# Patient Record
Sex: Male | Born: 1995 | Race: Black or African American | Hispanic: No | Marital: Single | Smoking: Current every day smoker
Health system: Southern US, Community
[De-identification: ages and names within clinical notes are randomized; demographics above are authoritative.]

## PROBLEM LIST (undated history)

## (undated) DIAGNOSIS — F419 Anxiety disorder, unspecified: Secondary | ICD-10-CM

## (undated) HISTORY — PX: CHOLECYSTECTOMY: SHX55

---
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
## 2017-04-21 ENCOUNTER — Emergency Department (HOSPITAL_BASED_OUTPATIENT_CLINIC_OR_DEPARTMENT_OTHER)
Admission: EM | Admit: 2017-04-21 | Discharge: 2017-04-21 | Disposition: A | Discharge status: Home / Self Care | Payer: BLUE CROSS/BLUE SHIELD | Attending: Emergency Medicine | Admitting: Emergency Medicine

## 2017-04-21 ENCOUNTER — Encounter (HOSPITAL_BASED_OUTPATIENT_CLINIC_OR_DEPARTMENT_OTHER): Payer: Self-pay

## 2017-04-21 DIAGNOSIS — F17200 Nicotine dependence, unspecified, uncomplicated: Secondary | ICD-10-CM | POA: Diagnosis not present

## 2017-04-21 DIAGNOSIS — Z202 Contact with and (suspected) exposure to infections with a predominantly sexual mode of transmission: Secondary | ICD-10-CM

## 2017-04-21 LAB — WET PREP, GENITAL
Sperm: NONE SEEN
Yeast Wet Prep HPF POC: NONE SEEN

## 2017-04-21 LAB — URINALYSIS, ROUTINE W REFLEX MICROSCOPIC
Bilirubin Urine: NEGATIVE
GLUCOSE, UA: NEGATIVE mg/dL
HGB URINE DIPSTICK: NEGATIVE
KETONES UR: NEGATIVE mg/dL
LEUKOCYTES UA: NEGATIVE
Nitrite: NEGATIVE
PROTEIN: NEGATIVE mg/dL
Specific Gravity, Urine: 1.025 (ref 1.005–1.030)
pH: 6 (ref 5.0–8.0)

## 2017-04-21 MED ORDER — AZITHROMYCIN 250 MG PO TABS
1000.0000 mg | ORAL_TABLET | Freq: Once | ORAL | Status: AC
  Administered 2017-04-21: 1000 mg via ORAL
  Filled 2017-04-21: qty 4

## 2017-04-21 MED ORDER — CEFTRIAXONE SODIUM 250 MG IJ SOLR
250.0000 mg | Freq: Once | INTRAMUSCULAR | Status: AC
  Administered 2017-04-21: 250 mg via INTRAMUSCULAR
  Filled 2017-04-21: qty 250

## 2017-04-21 MED ORDER — METRONIDAZOLE 500 MG PO TABS
2000.0000 mg | ORAL_TABLET | Freq: Once | ORAL | Status: AC
  Administered 2017-04-21: 2000 mg via ORAL
  Filled 2017-04-21: qty 4

## 2017-04-21 NOTE — ED Triage Notes (Signed)
Pt states male he had intercourse with called yesterday and advised him she was pos for STD-pt denies penile d/c and dysuria

## 2017-04-21 NOTE — Discharge Instructions (Addendum)
You have been treated for gonorrhea, chlamydia, and trichomonas today. Do not drink alcohol within 24 hours of today's visit, as it could interact with the medications were given make you feel very sick. You will be called in 3 days this days if any of your tests return positive. In that case, please make all of your sexual partners aware that they will need to be treated as well. Abstain from intercourse for one week until you have both been treated. Use condoms in the future to help prevent sexually transmitted disease and unwanted pregnancy. You can go to the health department in the future for free STD testing.

## 2017-04-21 NOTE — ED Provider Notes (Signed)
[facility] Provider [facility]   CSN: [id number] Arrival date & time: 04/21/17  1520     History   Chief Complaint Chief Complaint  Patient presents with  . Exposure to STD    HPI Jeffrey Mercer is a 21 y.o. male who presents with concern for STD exposure. Patient denies any symptoms such as dysuria, penile discharge, testicular pain or swelling. He reports he was called by a male that he recently had intercourse who was exposed to disease that starts with a "T." that is all he knows. He presents to be checked. He denies any fevers, abdominal pain, nausea, vomiting.  HPI  History reviewed. No pertinent past medical history.  There are no active problems to display for this patient.   Past Surgical History:  Procedure Laterality Date  . CHOLECYSTECTOMY         Home Medications    Prior to Admission medications   Not on File    Family History No family history on file.  Social History Social History  Substance Use Topics  . Smoking status: Current Every Day Smoker  . Smokeless tobacco: Never Used  . Alcohol use Yes     Comment: weekly     Allergies   Patient has no known allergies.   Review of Systems Review of Systems  Constitutional: Negative for fever.  Gastrointestinal: Negative for abdominal pain, nausea and vomiting.  Genitourinary: Negative for discharge, dysuria, penile pain, penile swelling, scrotal swelling and testicular pain.     Physical Exam Updated Vital Signs BP (!) 142/85 (BP Location: Left Arm)   Pulse 72   Temp 98.2 F (36.8 C) (Oral)   Resp 18   Ht  (1.753 m)   Wt 72.6 kg (160 lb)   SpO2 100%   BMI 23.63 kg/m   Physical Exam  Constitutional: He appears well-developed and well-nourished. No distress.  HENT:  Head: Normocephalic and atraumatic.  Mouth/Throat: Oropharynx is clear and moist. No oropharyngeal exudate.  Eyes: Pupils are equal, round, and reactive to light. Conjunctivae are normal. Right eye  exhibits no discharge. Left eye exhibits no discharge. No scleral icterus.  Neck: Normal range of motion. Neck supple. No thyromegaly present.  Cardiovascular: Normal rate, regular rhythm, normal heart sounds and intact distal pulses.  Exam reveals no gallop and no friction rub.   No murmur heard. Pulmonary/Chest: Effort normal and breath sounds normal. No stridor. No respiratory distress. He has no wheezes. He has no rales.  Abdominal: Soft. Bowel sounds are normal. He exhibits no distension. There is no tenderness. There is no rebound and no guarding.  Genitourinary: Testes normal and penis normal. Right testis shows no swelling and no tenderness. Left testis shows no swelling and no tenderness. Circumcised. No discharge found.  Musculoskeletal: He exhibits no edema.  Lymphadenopathy:    He has no cervical adenopathy.  Neurological: He is alert. Coordination normal.  Skin: Skin is warm and dry. No rash noted. He is not diaphoretic. No pallor.  Psychiatric: He has a normal mood and affect.  Nursing note and vitals reviewed.    ED Treatments / Results  Labs (all labs ordered are listed, but only abnormal results are displayed) Labs Reviewed  WET PREP, GENITAL - Abnormal; Notable for the following:       Result Value   Trich, Wet Prep PRESENT (*)    Clue Cells Wet Prep HPF POC PRESENT (*)    WBC, Wet Prep HPF POC FEW (*)  All other components within normal limits  URINALYSIS, ROUTINE W REFLEX MICROSCOPIC  GC/CHLAMYDIA PROBE AMP ([place]) NOT AT [facility]    EKG  EKG Interpretation None       Radiology No results found.  Procedures Procedures (including critical care time)  Medications Ordered in ED Medications  cefTRIAXone (ROCEPHIN) injection 250 mg (250 mg Intramuscular Given 04/21/17 1700)  azithromycin (ZITHROMAX) tablet 1,000 mg (1,000 mg Oral Given 04/21/17 1658)  metroNIDAZOLE (FLAGYL) tablet 2,000 mg (2,000 mg Oral Given 04/21/17 1659)     Initial  Impression / Assessment and Plan / ED Course  I have reviewed the triage vital signs and the nursing notes.  Pertinent labs & imaging results that were available during my care of the patient were reviewed by me and considered in my medical decision making (see chart for details).     Patient treated in the ED for STI with Rocephin, azithromycin, Flagyl. He was advised to avoid alcohol for 24 hours after treatment. Wet prep positive for Trichomonas. UA negative. Patient advised to inform and treat all sexual partners.  Pt advised on safe sex practices and understands that they have GC/Chlamydia cultures pending and will result in 2-3 days. HIV and RPR declined. Pt encouraged to follow up at local health department for future STI checks. No concern for prostatitis or epididymitis. Discussed return precautions. Patient understands and agrees with plan. Patient vitals stable throughout ED course and discharged in satisfactory condition.    Final Clinical Impressions(s) / ED Diagnoses   Final diagnoses:  Exposure to STD    New Prescriptions There are no discharge medications for this patient.    Emi Holes, PA-C 04/21/17 1729    Pricilla Loveless, MD 04/21/17 [phone]

## 2017-04-24 LAB — GC/CHLAMYDIA PROBE AMP (~~LOC~~) NOT AT ARMC
CHLAMYDIA, DNA PROBE: NEGATIVE
Neisseria Gonorrhea: NEGATIVE

## 2017-10-10 DIAGNOSIS — S52509A Unspecified fracture of the lower end of unspecified radius, initial encounter for closed fracture: Secondary | ICD-10-CM | POA: Insufficient documentation

## 2017-12-07 DIAGNOSIS — M25631 Stiffness of right wrist, not elsewhere classified: Secondary | ICD-10-CM | POA: Insufficient documentation

## 2018-03-06 ENCOUNTER — Emergency Department (HOSPITAL_BASED_OUTPATIENT_CLINIC_OR_DEPARTMENT_OTHER)
Admission: EM | Admit: 2018-03-06 | Discharge: 2018-03-06 | Disposition: A | Discharge status: Home / Self Care | Payer: BLUE CROSS/BLUE SHIELD | Attending: Emergency Medicine | Admitting: Emergency Medicine

## 2018-03-06 ENCOUNTER — Encounter (HOSPITAL_BASED_OUTPATIENT_CLINIC_OR_DEPARTMENT_OTHER): Payer: Self-pay | Admitting: *Deleted

## 2018-03-06 ENCOUNTER — Other Ambulatory Visit: Payer: Self-pay

## 2018-03-06 DIAGNOSIS — K219 Gastro-esophageal reflux disease without esophagitis: Secondary | ICD-10-CM | POA: Insufficient documentation

## 2018-03-06 DIAGNOSIS — F17200 Nicotine dependence, unspecified, uncomplicated: Secondary | ICD-10-CM | POA: Insufficient documentation

## 2018-03-06 DIAGNOSIS — Z202 Contact with and (suspected) exposure to infections with a predominantly sexual mode of transmission: Secondary | ICD-10-CM | POA: Insufficient documentation

## 2018-03-06 DIAGNOSIS — Z711 Person with feared health complaint in whom no diagnosis is made: Secondary | ICD-10-CM

## 2018-03-06 LAB — URINALYSIS, ROUTINE W REFLEX MICROSCOPIC
Bilirubin Urine: NEGATIVE
GLUCOSE, UA: NEGATIVE mg/dL
Hgb urine dipstick: NEGATIVE
Ketones, ur: 15 mg/dL — AB
Leukocytes, UA: NEGATIVE
Nitrite: NEGATIVE
PROTEIN: NEGATIVE mg/dL
Specific Gravity, Urine: 1.02 (ref 1.005–1.030)
pH: 6 (ref 5.0–8.0)

## 2018-03-06 MED ORDER — PANTOPRAZOLE SODIUM 20 MG PO TBEC: 20.0000 mg | DELAYED_RELEASE_TABLET | Freq: Every day | ORAL | 0 refills | Status: AC

## 2018-03-06 NOTE — ED Triage Notes (Signed)
States it has been a year since he had an STD check and feels it is time for another check. No symptoms. States he has an upset stomach after he eats salad. He is constipated and has blood in his stools after a BM sometimes.

## 2018-03-06 NOTE — ED Provider Notes (Signed)
[facility] EMERGENCY DEPARTMENT Provider Note   CSN: [id number] Arrival date & time: 03/06/18  1302     History   Chief Complaint Chief Complaint  Patient presents with  . SEXUALLY TRANSMITTED DISEASE    HPI Jeffrey Mercer is a 22 y.o. male.  Patient here with multiple complaints.  First, patient is here wanting an STD check in with gonorrhea, chlamydia, HIV testing.  He denies any symptoms including penile discharge, dysuria, painful lesions.  No history of STDs.  Patient also here as he has had some reflux type symptoms with eating greasy foods.  Patient had his gallbladder removed in the past and ever since has had difficulty eating hamburgers and other fatty foods.  Intermittently he will have some blood in his stool but has not had any and multiple days to weeks.  The history is provided by the patient.  Illness  This is a new problem. The current episode started more than 1 week ago. The problem occurs constantly. The problem has not changed since onset.Pertinent negatives include no chest pain, no abdominal pain, no headaches and no shortness of breath. The symptoms are aggravated by eating. Nothing relieves the symptoms. He has tried nothing for the symptoms. The treatment provided no relief.    History reviewed. No pertinent past medical history.  There are no active problems to display for this patient.   Past Surgical History:  Procedure Laterality Date  . CHOLECYSTECTOMY          Home Medications    Prior to Admission medications   Medication Sig Start Date End Date Taking? Authorizing Provider  pantoprazole (PROTONIX) 20 MG tablet Take 1 tablet (20 mg total) by mouth daily. 03/06/18 04/05/18  Virgina Norfolk, DO    Family History No family history on file.  Social History Social History   Tobacco Use  . Smoking status: Current Every Day Smoker  . Smokeless tobacco: Never Used  Substance Use Topics  . Alcohol use: Yes    Comment: weekly  .  Drug use: Not on file     Allergies   Patient has no known allergies.   Review of Systems Review of Systems  Constitutional: Negative for chills and fever.  HENT: Negative for ear pain and sore throat.   Eyes: Negative for pain and visual disturbance.  Respiratory: Negative for cough and shortness of breath.   Cardiovascular: Negative for chest pain and palpitations.  Gastrointestinal: Positive for blood in stool. Negative for abdominal distention, abdominal pain, constipation, diarrhea, nausea, rectal pain and vomiting.  Genitourinary: Negative for dysuria and hematuria.  Musculoskeletal: Negative for arthralgias and back pain.  Skin: Negative for color change and rash.  Neurological: Negative for seizures, syncope and headaches.  All other systems reviewed and are negative.    Physical Exam Updated Vital Signs  ED Triage Vitals  Enc Vitals Group     BP 03/06/18 1310 (!) 141/99     Pulse Rate 03/06/18 1310 64     Resp 03/06/18 1310 18     Temp 03/06/18 1310 98.4 F (36.9 C)     Temp Source 03/06/18 1310 Oral     SpO2 03/06/18 1310 99 %     Weight 03/06/18 1308 160 lb (72.6 kg)     Height 03/06/18 1308 5\' 9"  (1.753 m)     Head Circumference --      Peak Flow --      Pain Score 03/06/18 1308 0     Pain Loc --  Pain Edu? --      Excl. in GC? --     Physical Exam  Constitutional: He appears well-developed and well-nourished.  HENT:  Head: Normocephalic and atraumatic.  Eyes: Conjunctivae are normal.  Neck: Normal range of motion. Neck supple.  Cardiovascular: Normal rate, regular rhythm, normal heart sounds and intact distal pulses.  No murmur heard. Pulmonary/Chest: Effort normal and breath sounds normal. No respiratory distress.  Abdominal: Soft. Bowel sounds are normal. There is no tenderness. Hernia confirmed negative in the right inguinal area and confirmed negative in the left inguinal area.  Genitourinary: Testes normal and penis normal. Cremasteric  reflex is present. Right testis shows no mass, no swelling and no tenderness. Left testis shows no mass, no swelling and no tenderness. No penile tenderness.  Musculoskeletal: He exhibits no edema.  Neurological: He is alert.  Skin: Skin is warm and dry.  Psychiatric: He has a normal mood and affect.  Nursing note and vitals reviewed.    ED Treatments / Results  Labs (all labs ordered are listed, but only abnormal results are displayed) Labs Reviewed  URINALYSIS, ROUTINE W REFLEX MICROSCOPIC - Abnormal; Notable for the following components:      Result Value   Ketones, ur 15 (*)    All other components within normal limits  HIV ANTIBODY (ROUTINE TESTING)  GC/CHLAMYDIA PROBE AMP ([place]) NOT AT [facility]    EKG None  Radiology No results found.  Procedures Procedures (including critical care time)  Medications Ordered in ED Medications - No data to display   Initial Impression / Assessment and Plan / ED Course  I have reviewed the triage vital signs and the nursing notes.  Pertinent labs & imaging results that were available during my care of the patient were reviewed by me and considered in my medical decision making (see chart for details).     Jeffrey Mercer is a 22 year old male with no significant medical history who presents to the ED for STD check and reflux.  Patient with normal vitals.  No fever.  Patient here for HIV testing and gonorrhea and Chlamydia testing.  Patient also states that ever since his gallbladder was taken out he has difficulty with fatty foods.  Sometimes has blood in his stool but has not had any in weeks.  Patient does not have primary caregiver.  Overall is asymptomatic.  Has no signs of lesions on his penis at this time.  No concern for herpes.  Gonorrhea/chlamydia testing collected as well as HIV testing.  Urinalysis unremarkable.  Patient appears to have symptoms consistent with reflux and given prescription for Protonix.  Given information to  follow-up with primary care doctor and discharged from ED in good condition.  Final Clinical Impressions(s) / ED Diagnoses   Final diagnoses:  Concern about STD in male without diagnosis  Gastroesophageal reflux disease without esophagitis    ED Discharge Orders         Ordered    pantoprazole (PROTONIX) 20 MG tablet  Daily     03/06/18 1446           Virgina NorfolkCuratolo, Hazley Dezeeuw, DO 03/06/18 1456

## 2018-03-07 LAB — GC/CHLAMYDIA PROBE AMP (~~LOC~~) NOT AT ARMC
CHLAMYDIA, DNA PROBE: NEGATIVE
NEISSERIA GONORRHEA: NEGATIVE

## 2018-03-07 LAB — HIV ANTIBODY (ROUTINE TESTING W REFLEX): HIV Screen 4th Generation wRfx: NONREACTIVE

## 2019-05-20 ENCOUNTER — Encounter (HOSPITAL_BASED_OUTPATIENT_CLINIC_OR_DEPARTMENT_OTHER): Payer: Self-pay

## 2019-05-20 ENCOUNTER — Emergency Department (HOSPITAL_BASED_OUTPATIENT_CLINIC_OR_DEPARTMENT_OTHER): Payer: BC Managed Care – PPO

## 2019-05-20 ENCOUNTER — Emergency Department (HOSPITAL_BASED_OUTPATIENT_CLINIC_OR_DEPARTMENT_OTHER)
Admission: EM | Admit: 2019-05-20 | Discharge: 2019-05-20 | Disposition: A | Discharge status: Home / Self Care | Payer: BC Managed Care – PPO | Attending: Emergency Medicine | Admitting: Emergency Medicine

## 2019-05-20 ENCOUNTER — Other Ambulatory Visit: Payer: Self-pay

## 2019-05-20 DIAGNOSIS — X500XXA Overexertion from strenuous movement or load, initial encounter: Secondary | ICD-10-CM | POA: Insufficient documentation

## 2019-05-20 DIAGNOSIS — Y929 Unspecified place or not applicable: Secondary | ICD-10-CM | POA: Insufficient documentation

## 2019-05-20 DIAGNOSIS — Y999 Unspecified external cause status: Secondary | ICD-10-CM | POA: Diagnosis not present

## 2019-05-20 DIAGNOSIS — S161XXA Strain of muscle, fascia and tendon at neck level, initial encounter: Secondary | ICD-10-CM | POA: Insufficient documentation

## 2019-05-20 DIAGNOSIS — M25511 Pain in right shoulder: Secondary | ICD-10-CM | POA: Diagnosis not present

## 2019-05-20 DIAGNOSIS — F17200 Nicotine dependence, unspecified, uncomplicated: Secondary | ICD-10-CM | POA: Diagnosis not present

## 2019-05-20 DIAGNOSIS — Y9389 Activity, other specified: Secondary | ICD-10-CM | POA: Diagnosis not present

## 2019-05-20 DIAGNOSIS — S199XXA Unspecified injury of neck, initial encounter: Secondary | ICD-10-CM | POA: Diagnosis present

## 2019-05-20 MED ORDER — METHOCARBAMOL 500 MG PO TABS: 500.0000 mg | ORAL_TABLET | Freq: Two times a day (BID) | ORAL | 0 refills | Status: AC

## 2019-05-20 NOTE — ED Notes (Signed)
ED Provider at bedside. 

## 2019-05-20 NOTE — ED Triage Notes (Signed)
Pt c/o pain to right posterior shoulder/back x 3 months-worse x 1 weeks-denies injury-states "keep getting a kink"-NAD-steady gait

## 2019-05-20 NOTE — Discharge Instructions (Addendum)
Please going to [employer] to find a primary care provider who will accept your insurance.  Please get established with a PCP as soon as possible for ongoing evaluation and management of your health and well-being.  I have also provided a referral for orthopedic evaluation should you continue to experience ongoing discomfort.  Please call to schedule an appointment.  You were given a prescription for Robaxin which is a muscle relaxer.  You should not drive, work, consume alcohol, or operate machinery while taking this medication as it can make you very drowsy.    Please return to the ED or seek medical attention should you develop any new or worsening symptoms.

## 2019-05-20 NOTE — ED Provider Notes (Signed)
[facility] EMERGENCY DEPARTMENT Provider Note   CSN: [id number] Arrival date & time: 05/20/19  1548     History   Chief Complaint Chief Complaint  Patient presents with  . Shoulder Pain    HPI Jeffrey Mercer is a 23 y.o. male with no significant past medical history presents to the ED with acute on chronic right scapular pain.  Patient reports that he has been having significant right scapular pain and tenderness for approximately 10 days.  He states that this has been an acute on chronic issue for him for years and that episodes typically last approximately 1 week and then resolve spontaneously.  He states that he is never had imaging done and is concerned for bony abnormalities or dislocation.  He does not see a PCP.  He states that he was stretching when he heard and felt a "pop".  No history of trauma to the area.  He denies any fevers or chills, IVDA, history of cancer, numbness, tingling, loss of strength, abdominal pain, chest pain, nausea or vomiting, or any other symptoms.  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     HPI  History reviewed. No pertinent past medical history.  There are no active problems to display for this patient.   Past Surgical History:  Procedure Laterality Date  . CHOLECYSTECTOMY          Home Medications    Prior to Admission medications   Medication Sig Start Date End Date Taking? Authorizing Provider  methocarbamol (ROBAXIN) 500 MG tablet Take 1 tablet (500 mg total) by mouth 2 (two) times daily. 05/20/19   Lorelee NewGreen, Valentine Barney L, PA-C  pantoprazole (PROTONIX) 20 MG tablet Take 1 tablet (20 mg total) by mouth daily. 03/06/18 04/05/18  Virgina Norfolkuratolo, Adam, DO    Family History No family history on file.  Social History Social History   Tobacco Use  . Smoking status: Current Every Day Smoker  . Smokeless tobacco: Never Used  Substance Use Topics  . Alcohol use: Yes    Comment: weekly  . Drug use: Never      Allergies   Patient has no known allergies.   Review of Systems Review of Systems  Constitutional: Negative for chills and fatigue.  Musculoskeletal: Negative for neck stiffness.  Neurological: Negative for dizziness, weakness and numbness.     Physical Exam Updated Vital Signs BP 134/75 (BP Location: Right Arm)   Pulse 63   Temp 98.2 F (36.8 C) (Oral)   Resp 14   Ht 5\' 9"  (1.753 m)   Wt 73.5 kg   SpO2 100%   BMI 23.92 kg/m   Physical Exam Vitals signs and nursing note reviewed. Exam conducted with a chaperone present.  Constitutional:      Appearance: Normal appearance.  HENT:     Head: Normocephalic and atraumatic.  Eyes:     General: No scleral icterus.    Conjunctiva/sclera: Conjunctivae normal.  Neck:     Musculoskeletal: Normal range of motion and neck supple. No neck rigidity or muscular tenderness.     Comments: No cervical midline spinal tenderness to palpation.  No overlying skin changes. Cardiovascular:     Rate and Rhythm: Normal rate and regular rhythm.     Pulses: Normal pulses.     Heart sounds: Normal heart sounds.  Pulmonary:     Effort: Pulmonary effort is normal. No respiratory distress.     Breath sounds: Normal breath sounds.  Musculoskeletal:  Comments: Right trapezius: Nontender except for localized TTP over right scapula.  Pain reproduced with right-sided head rotation.  No overlying skin changes.  No radiation to or from cervical spine. Right shoulder: ROM fully intact.  Strength intact.  Elbow and wrist range of motion strength intact.  Distal pulses and cap refill intact.  Sensation intact throughout.  Skin:    General: Skin is dry.     Capillary Refill: Capillary refill takes less than 2 seconds.  Neurological:     Mental Status: He is alert and oriented to person, place, and time.     GCS: GCS eye subscore is 4. GCS verbal subscore is 5. GCS motor subscore is 6.     Sensory: No sensory deficit.     Gait: Gait normal.   Psychiatric:        Mood and Affect: Mood normal.        Behavior: Behavior normal.        Thought Content: Thought content normal.      ED Treatments / Results  Labs (all labs ordered are listed, but only abnormal results are displayed) Labs Reviewed - No data to display  EKG None  Radiology Dg Scapula Right  Result Date: 05/20/2019 CLINICAL DATA:  Scapular pain the EXAM: RIGHT SCAPULA - 2+ VIEWS COMPARISON:  None. FINDINGS: There is no evidence of fracture or other focal bone lesions. Glenohumeral and acromioclavicular joints appear normal. Soft tissues are unremarkable. IMPRESSION: Negative. Electronically Signed   By: Davina Poke M.D.   On: 05/20/2019 18:06    Procedures Procedures (including critical care time)  Medications Ordered in ED Medications - No data to display   Initial Impression / Assessment and Plan / ED Course  I have reviewed the triage vital signs and the nursing notes.  Pertinent labs & imaging results that were available during my care of the patient were reviewed by me and considered in my medical decision making (see chart for details).       Patient's history of a "pop" and describing it as a "tweak" is consistent with a right-sided trapezial strain with muscle spasm.  Patient however has a strange history of episodic right scapular pain with localization over the bone and no significant trapezial tenderness to palpation.  The pain is not worsened with right arm and shoulder range of motion exercises.  His shoulder and elbow ROM and strength are all intact.  He is neurovascularly intact, as well.  Patient reports that he has had this pain for over a year and was hoping to get an x-ray, felt as though it was a reasonable request given his atypical presentation.   DG right scapula was obtained and did not demonstrate any evidence of fracture, dislocation, or other bony lesions concerning for malignancy or other disease.  Patient was reassured by  the findings.  Recommended that he follow-up with orthopedics for further evaluation and management of his ongoing right scapular pain.  He denies any knowledge of symptoms and do not believe that it is referred pain.  He denies any abdominal discomfort, chest pain, right upper quadrant pain, nausea or vomiting, or any pain elsewhere on the body.  I also encouraged him to get established with PCP as he reports that he has [employer] and he should be able to easily find someone that accepts [employer].    All prescribed a short course of Robaxin for his suspected muscular spasm and injury.  Encouraged him to continue taking  ibuprofen, as prescribed.  He will follow up with the orthopedist if the pain continues to persist for further evaluation and ongoing care.  Recommend that you return to the ED for any new or worsening symptoms.  Patient voiced understanding of the assessment and work-up and is agreeable to plan.   Final Clinical Impressions(s) / ED Diagnoses   Final diagnoses:  Cervical strain, acute, initial encounter    ED Discharge Orders         Ordered    methocarbamol (ROBAXIN) 500 MG tablet  2 times daily     05/20/19 1824           Lorelee New, PA-C 05/20/19 1830    Linwood Dibbles, MD 05/20/19 [phone]

## 2019-07-29 ENCOUNTER — Emergency Department (HOSPITAL_BASED_OUTPATIENT_CLINIC_OR_DEPARTMENT_OTHER)
Admission: EM | Admit: 2019-07-29 | Discharge: 2019-07-29 | Discharge status: Against Medical Advice | Payer: BC Managed Care – PPO | Attending: Emergency Medicine | Admitting: Emergency Medicine

## 2019-07-29 ENCOUNTER — Encounter (HOSPITAL_BASED_OUTPATIENT_CLINIC_OR_DEPARTMENT_OTHER): Payer: Self-pay

## 2019-07-29 ENCOUNTER — Other Ambulatory Visit: Payer: Self-pay

## 2019-07-29 ENCOUNTER — Emergency Department (HOSPITAL_BASED_OUTPATIENT_CLINIC_OR_DEPARTMENT_OTHER): Payer: BC Managed Care – PPO

## 2019-07-29 DIAGNOSIS — F419 Anxiety disorder, unspecified: Secondary | ICD-10-CM | POA: Insufficient documentation

## 2019-07-29 DIAGNOSIS — R0602 Shortness of breath: Secondary | ICD-10-CM

## 2019-07-29 DIAGNOSIS — F17210 Nicotine dependence, cigarettes, uncomplicated: Secondary | ICD-10-CM | POA: Insufficient documentation

## 2019-07-29 DIAGNOSIS — R000 Tachycardia, unspecified: Secondary | ICD-10-CM | POA: Diagnosis present

## 2019-07-29 LAB — CBC
HCT: 40.8 % (ref 39.0–52.0)
Hemoglobin: 13.9 g/dL (ref 13.0–17.0)
MCH: 31 pg (ref 26.0–34.0)
MCHC: 34.1 g/dL (ref 30.0–36.0)
MCV: 91.1 fL (ref 80.0–100.0)
Platelets: 289 10*3/uL (ref 150–400)
RBC: 4.48 MIL/uL (ref 4.22–5.81)
RDW: 13 % (ref 11.5–15.5)
WBC: 10.3 10*3/uL (ref 4.0–10.5)
nRBC: 0 % (ref 0.0–0.2)

## 2019-07-29 LAB — COMPREHENSIVE METABOLIC PANEL
ALT: 18 U/L (ref 0–44)
AST: 31 U/L (ref 15–41)
Albumin: 4.8 g/dL (ref 3.5–5.0)
Alkaline Phosphatase: 53 U/L (ref 38–126)
Anion gap: 17 — ABNORMAL HIGH (ref 5–15)
BUN: 7 mg/dL (ref 6–20)
CO2: 18 mmol/L — ABNORMAL LOW (ref 22–32)
Calcium: 9.3 mg/dL (ref 8.9–10.3)
Chloride: 99 mmol/L (ref 98–111)
Creatinine, Ser: 0.84 mg/dL (ref 0.61–1.24)
GFR calc Af Amer: >60 mL/min (ref >60)
GFR calc non Af Amer: >60 mL/min (ref >60)
Glucose, Bld: 139 mg/dL — ABNORMAL HIGH (ref 70–99)
Potassium: 3.1 mmol/L — ABNORMAL LOW (ref 3.5–5.1)
Sodium: 134 mmol/L — ABNORMAL LOW (ref 135–145)
Total Bilirubin: 0.7 mg/dL (ref 0.3–1.2)
Total Protein: 8.1 g/dL (ref 6.5–8.1)

## 2019-07-29 LAB — TROPONIN I (HIGH SENSITIVITY): Troponin I (High Sensitivity): 2 ng/L (ref <18)

## 2019-07-29 MED ORDER — LORAZEPAM 2 MG/ML IJ SOLN
INTRAMUSCULAR | Status: AC
  Filled 2019-07-29: qty 1

## 2019-07-29 MED ORDER — SODIUM CHLORIDE 0.9 % IV BOLUS
1000.0000 mL | Freq: Once | INTRAVENOUS | Status: AC
  Administered 2019-07-29: 1000 mL via INTRAVENOUS

## 2019-07-29 MED ORDER — LORAZEPAM 2 MG/ML IJ SOLN
1.0000 mg | Freq: Once | INTRAMUSCULAR | Status: AC
  Administered 2019-07-29: 1 mg via INTRAVENOUS

## 2019-07-29 MED ORDER — HALOPERIDOL LACTATE 5 MG/ML IJ SOLN
5.0000 mg | Freq: Once | INTRAMUSCULAR | Status: AC
  Administered 2019-07-29: 5 mg via INTRAMUSCULAR
  Filled 2019-07-29: qty 1

## 2019-07-29 MED ORDER — IOHEXOL 350 MG/ML SOLN
100.0000 mL | Freq: Once | INTRAVENOUS | Status: AC | PRN
  Administered 2019-07-29: 100 mL via INTRAVENOUS

## 2019-07-29 NOTE — ED Provider Notes (Signed)
[facility] Emergency Department Provider Note MRN:  [id number]  Arrival date & time: 07/29/19     Chief Complaint   Tachycardia   History of Present Illness   Jeffrey Mercer is a 24 y.o. year-old male with a history of cholecystectomy presenting to the ED with chief complaint of tachycardia.  Patient feeling generally unwell today, anxious, jittery, chest discomfort, trouble breathing.  Describes it as a "anxiety, feels like a panic attack".  Has been having a lot of issues with his relationship with his girlfriend recently.  Denies any history of blood clots, no fever, no cough, no abdominal pain, no leg pain or swelling, no recent travel.  Symptoms currently moderate in severity, constant.  Review of Systems  A complete 10 system review of systems was obtained and all systems are negative except as noted in the HPI and PMH.   Patient's Health History   History reviewed. No pertinent past medical history.  Past Surgical History:  Procedure Laterality Date  . CHOLECYSTECTOMY      No family history on file.  Social History   Socioeconomic History  . Marital status: Single    Spouse name: Not on file  . Number of children: Not on file  . Years of education: Not on file  . Highest education level: Not on file  Occupational History  . Not on file  Tobacco Use  . Smoking status: Current Every Day Smoker  . Smokeless tobacco: Never Used  Substance and Sexual Activity  . Alcohol use: Yes    Comment: weekly  . Drug use: Never  . Sexual activity: Not on file  Other Topics Concern  . Not on file  Social History Narrative  . Not on file   Social Determinants of Health   Financial Resource Strain:   . Difficulty of Paying Living Expenses: Not on file  Food Insecurity:   . Worried About Charity fundraiser in the Last Year: Not on file  . Ran Out of Food in the Last Year: Not on file  Transportation Needs:   . Lack of Transportation (Medical):  Not on file  . Lack of Transportation (Non-Medical): Not on file  Physical Activity:   . Days of Exercise per Week: Not on file  . Minutes of Exercise per Session: Not on file  Stress:   . Feeling of Stress : Not on file  Social Connections:   . Frequency of Communication with Friends and Family: Not on file  . Frequency of Social Gatherings with Friends and Family: Not on file  . Attends Religious Services: Not on file  . Active Member of Clubs or Organizations: Not on file  . Attends Archivist Meetings: Not on file  . Marital Status: Not on file  Intimate Partner Violence:   . Fear of Current or Ex-Partner: Not on file  . Emotionally Abused: Not on file  . Physically Abused: Not on file  . Sexually Abused: Not on file     Physical Exam  Vital Signs and Nursing Notes reviewed Vitals:   07/29/19 1900 07/29/19 1930  BP: (!) 141/88 122/76  Pulse: 100 94  Resp: 11 13  Temp:    SpO2: 99% 99%    CONSTITUTIONAL: Well-appearing, NAD NEURO:  Alert and oriented x 3, no focal deficits EYES:  eyes equal and reactive ENT/NECK:  no LAD, no JVD CARDIO: Tachycardic rate, well-perfused, normal S1 and S2 PULM:  CTAB no wheezing or rhonchi GI/GU:  normal bowel sounds, non-distended, non-tender MSK/SPINE:  No gross deformities, no edema SKIN:  no rash, atraumatic PSYCH:  Appropriate speech and behavior  Diagnostic and Interventional Summary    EKG Interpretation  Date/Time:  Monday July 29 2019 16:54:27 EST Ventricular Rate:  138 PR Interval:  130 QRS Duration: 82 QT Interval:  370 QTC Calculation: 560 R Axis:   87 Text Interpretation: Sinus tachycardia Nonspecific ST and T wave abnormality Abnormal ECG Confirmed by Kennis Carina [phone]) on 07/29/2019 5:01:46 PM      Labs Reviewed  COMPREHENSIVE METABOLIC PANEL - Abnormal; Notable for the following components:      Result Value   Sodium 134 (*)    Potassium 3.1 (*)    CO2 18 (*)    Glucose, Bld 139 (*)     Anion gap 17 (*)    All other components within normal limits  CBC  TROPONIN I (HIGH SENSITIVITY)  TROPONIN I (HIGH SENSITIVITY)    CT ANGIO CHEST PE W OR WO CONTRAST  Final Result    DG Chest Port 1 View  Final Result      Medications  sodium chloride 0.9 % bolus 1,000 mL ( Intravenous Stopped 07/29/19 1906)  LORazepam (ATIVAN) injection 1 mg (1 mg Intravenous Given 07/29/19 1722)  iohexol (OMNIPAQUE) 350 MG/ML injection 100 mL (100 mLs Intravenous Contrast Given 07/29/19 1812)     Procedures  /  Critical Care Procedures  ED Course and Medical Decision Making  I have reviewed the triage vital signs, the nursing notes, and pertinent available records from the EMR.  Pertinent labs & imaging results that were available during my care of the patient were reviewed by me and considered in my medical decision making (see below for details).     Favoring anxiety is the driving force of patient's tachycardia and symptoms but must also consider cardiac arrhythmia, pneumonia, pulmonary believes him.  Heart rate in the 120s on my evaluation, normotensive.  EKG with sinus tachycardia.  No evidence of DVT, lungs clear, will trial fluids, Ativan, reassess.  Not a great response to Ativan, continued tachypnea and tachycardia.  CTA obtained and is negative for PE.  Work-up is reassuring, vital signs have normalized, patient is feeling better, screened for significant depression and he denies suicidal or homicidal ideation, no AVH, continues to discuss his work and relationship stresses.  Advised psychiatry follow-up.  Appropriate for discharge.  Elmer Sow. Pilar Plate, MD [facility] Emergency Medicine [facility] [email or web address]  Final Clinical Impressions(s) / ED Diagnoses     ICD-10-CM   1. Anxiety  F41.9   2. Shortness of breath  R06.02     ED Discharge Orders    None       Discharge Instructions Discussed with and Provided to Patient:     Discharge Instructions       You were evaluated in the Emergency Department and after careful evaluation, we did not find any emergent condition requiring admission or further testing in the hospital.  Your exam/testing today is overall reassuring.  We encourage that she follow-up with a therapist or psychiatrist to further discuss your anxiety.  Please return to the Emergency Department if you experience any worsening of your condition.  We encourage you to follow up with a primary care provider.  Thank you for allowing [place] to be a part of your care.       Sabas Sous, MD 07/29/19 2001

## 2019-07-29 NOTE — ED Triage Notes (Addendum)
Pt states he was coughing, sneezing this am-he [place] coffee [facility] [place] went for a jog-states he felt like his heart was racing-states he is feeling "anxious and feels like a panic attack"-denies hx of either-pt hyperventilating-encouraged to take slow deep breaths-to triage in w/c

## 2019-07-29 NOTE — Discharge Instructions (Addendum)
You were evaluated in the Emergency Department and after careful evaluation, we did not find any emergent condition requiring admission or further testing in the hospital.  Your exam/testing today is overall reassuring.  We encourage that she follow-up with a therapist or psychiatrist to further discuss your anxiety.  Please return to the Emergency Department if you experience any worsening of your condition.  We encourage you to follow up with a primary care provider.  Thank you for allowing [place] to be a part of your care.

## 2019-07-29 NOTE — ED Notes (Signed)
XR at bedside

## 2019-07-29 NOTE — ED Notes (Signed)
Wallet and cell phone brought in by patients uncle, given to patient

## 2019-07-29 NOTE — ED Notes (Signed)
Patient transported to CT 

## 2019-07-29 NOTE — ED Notes (Signed)
ED Provider at bedside. 

## 2019-07-29 NOTE — ED Notes (Signed)
Patient stated shortness of breath upon entry to room to discharge patient. Provider informed of potential panic attack by patient.

## 2019-07-29 NOTE — ED Notes (Signed)
Patient encouraged to take slow deep breaths to help with "panic attack". Patient informed of oxygenation saturation within normal limits. Patient given fan and blankets.

## 2019-07-29 NOTE — ED Notes (Signed)
Patient noted fully dressed and walking down hallway to exit. Patient asked to return to room for discharge paperwork. Patient responded with rude gesture. Provider and charge informed of elopement.

## 2019-08-26 ENCOUNTER — Emergency Department (HOSPITAL_BASED_OUTPATIENT_CLINIC_OR_DEPARTMENT_OTHER): Payer: BC Managed Care – PPO

## 2019-08-26 ENCOUNTER — Emergency Department (HOSPITAL_BASED_OUTPATIENT_CLINIC_OR_DEPARTMENT_OTHER)
Admission: EM | Admit: 2019-08-26 | Discharge: 2019-08-26 | Disposition: A | Discharge status: Home / Self Care | Payer: BC Managed Care – PPO | Attending: Emergency Medicine | Admitting: Emergency Medicine

## 2019-08-26 ENCOUNTER — Encounter (HOSPITAL_BASED_OUTPATIENT_CLINIC_OR_DEPARTMENT_OTHER): Payer: Self-pay | Admitting: Emergency Medicine

## 2019-08-26 ENCOUNTER — Other Ambulatory Visit: Payer: Self-pay

## 2019-08-26 ENCOUNTER — Telehealth: Payer: Self-pay | Admitting: *Deleted

## 2019-08-26 DIAGNOSIS — F17200 Nicotine dependence, unspecified, uncomplicated: Secondary | ICD-10-CM | POA: Insufficient documentation

## 2019-08-26 DIAGNOSIS — R000 Tachycardia, unspecified: Secondary | ICD-10-CM | POA: Diagnosis not present

## 2019-08-26 DIAGNOSIS — R002 Palpitations: Secondary | ICD-10-CM

## 2019-08-26 DIAGNOSIS — R0602 Shortness of breath: Secondary | ICD-10-CM | POA: Insufficient documentation

## 2019-08-26 DIAGNOSIS — Z79899 Other long term (current) drug therapy: Secondary | ICD-10-CM | POA: Insufficient documentation

## 2019-08-26 HISTORY — DX: Anxiety disorder, unspecified: F41.9

## 2019-08-26 LAB — CBC WITH DIFFERENTIAL/PLATELET
Abs Immature Granulocytes: 0.04 10*3/uL (ref 0.00–0.07)
Basophils Absolute: 0.1 10*3/uL (ref 0.0–0.1)
Basophils Relative: 1 %
Eosinophils Absolute: 0.1 10*3/uL (ref 0.0–0.5)
Eosinophils Relative: 1 %
HCT: 42.9 % (ref 39.0–52.0)
Hemoglobin: 14.3 g/dL (ref 13.0–17.0)
Immature Granulocytes: 0 %
Lymphocytes Relative: 40 %
Lymphs Abs: 4.2 10*3/uL — ABNORMAL HIGH (ref 0.7–4.0)
MCH: 30 pg (ref 26.0–34.0)
MCHC: 33.3 g/dL (ref 30.0–36.0)
MCV: 89.9 fL (ref 80.0–100.0)
Monocytes Absolute: 1.1 10*3/uL — ABNORMAL HIGH (ref 0.1–1.0)
Monocytes Relative: 11 %
Neutro Abs: 5.2 10*3/uL (ref 1.7–7.7)
Neutrophils Relative %: 47 %
Platelets: 357 10*3/uL (ref 150–400)
RBC: 4.77 MIL/uL (ref 4.22–5.81)
RDW: 13 % (ref 11.5–15.5)
WBC: 10.7 10*3/uL — ABNORMAL HIGH (ref 4.0–10.5)
nRBC: 0 % (ref 0.0–0.2)

## 2019-08-26 LAB — RAPID URINE DRUG SCREEN, HOSP PERFORMED
Amphetamines: POSITIVE — AB
Barbiturates: NOT DETECTED
Benzodiazepines: NOT DETECTED
Cocaine: NOT DETECTED
Opiates: NOT DETECTED
Tetrahydrocannabinol: POSITIVE — AB

## 2019-08-26 LAB — BASIC METABOLIC PANEL
Anion gap: 19 — ABNORMAL HIGH (ref 5–15)
BUN: 7 mg/dL (ref 6–20)
CO2: 20 mmol/L — ABNORMAL LOW (ref 22–32)
Calcium: 9.7 mg/dL (ref 8.9–10.3)
Chloride: 99 mmol/L (ref 98–111)
Creatinine, Ser: 0.83 mg/dL (ref 0.61–1.24)
GFR calc Af Amer: >60 mL/min (ref >60)
GFR calc non Af Amer: >60 mL/min (ref >60)
Glucose, Bld: 144 mg/dL — ABNORMAL HIGH (ref 70–99)
Potassium: 3.3 mmol/L — ABNORMAL LOW (ref 3.5–5.1)
Sodium: 138 mmol/L (ref 135–145)

## 2019-08-26 LAB — TROPONIN I (HIGH SENSITIVITY): Troponin I (High Sensitivity): 2 ng/L (ref <18)

## 2019-08-26 LAB — ETHANOL: Alcohol, Ethyl (B): <10 mg/dL (ref <10)

## 2019-08-26 LAB — D-DIMER, QUANTITATIVE: D-Dimer, Quant: 0.53 ug/mL-FEU — ABNORMAL HIGH (ref 0.00–0.50)

## 2019-08-26 MED ORDER — SODIUM CHLORIDE 0.9 % IV BOLUS
1000.0000 mL | Freq: Once | INTRAVENOUS | Status: AC
  Administered 2019-08-26: 1000 mL via INTRAVENOUS

## 2019-08-26 MED ORDER — LORAZEPAM 2 MG/ML IJ SOLN
1.0000 mg | Freq: Once | INTRAMUSCULAR | Status: AC
  Administered 2019-08-26: 1 mg via INTRAVENOUS

## 2019-08-26 MED ORDER — LORAZEPAM 2 MG/ML IJ SOLN
INTRAMUSCULAR | Status: AC
  Filled 2019-08-26: qty 1

## 2019-08-26 NOTE — ED Provider Notes (Signed)
[facility] EMERGENCY DEPARTMENT Provider Note   CSN: [id number] Arrival date & time: 08/26/19  0011     History Chief Complaint  Patient presents with  . Anxiety  . Tachycardia    Jeffrey Mercer is a 24 y.o. male.  HPI     This is a 24 year old male who presents with palpitations and shortness of breath.  Patient reports onset of symptoms approximately 1 hour prior to arrival.  He states that he was in bed when he had acute onset of symptoms.  He describes palpitations, shortness of breath.  He states that his symptoms have gotten worse on his drive here.  He reports bilateral hand tingling and "locking up."  Patient reports he was diagnosed with COVID-19 on January 19.  He has not had any ongoing issues with this.  He describes difficulty taking a deep breath.  Patient reports that he did binge drink Saturday night into Sunday morning.  He states he felt generally poorly during the day yesterday.  He denies any drug use.    Past Medical History:  Diagnosis Date  . Anxiety     There are no problems to display for this patient.   Past Surgical History:  Procedure Laterality Date  . CHOLECYSTECTOMY         No family history on file.  Social History   Tobacco Use  . Smoking status: Current Every Day Smoker  . Smokeless tobacco: Never Used  Substance Use Topics  . Alcohol use: Yes    Comment: weekly  . Drug use: Never    Home Medications Prior to Admission medications   Medication Sig Start Date End Date Taking? Authorizing Provider  hydrOXYzine (ATARAX/VISTARIL) 25 MG tablet Take 25 mg by mouth 3 (three) times daily as needed.   Yes [provider]  methocarbamol (ROBAXIN) 500 MG tablet Take 1 tablet (500 mg total) by mouth 2 (two) times daily. 05/20/19   Corena Herter, PA-C  pantoprazole (PROTONIX) 20 MG tablet Take 1 tablet (20 mg total) by mouth daily. 03/06/18 04/05/18  Lennice Sites, DO    Allergies    Patient has no known  allergies.  Review of Systems   Review of Systems  Constitutional: Negative for fever.  Respiratory: Positive for shortness of breath. Negative for cough.   Cardiovascular: Positive for palpitations. Negative for chest pain.  Gastrointestinal: Negative for abdominal pain, nausea and vomiting.  Genitourinary: Negative for dysuria.  Musculoskeletal: Negative for back pain.  Neurological: Negative for headaches.  Psychiatric/Behavioral: The patient is nervous/anxious.   All other systems reviewed and are negative.   Physical Exam Updated Vital Signs BP (!) 155/81 (BP Location: Right Arm)   Pulse (!) 103   Temp 98.4 F (36.9 C) (Oral)   Resp 14   Ht 1.778 m (5\' 10" )   Wt 77.1 kg   SpO2 100%   BMI 24.39 kg/m   Physical Exam Vitals and nursing note reviewed.  Constitutional:      Appearance: He is well-developed.     Comments: Anxious appearing but nontoxic  HENT:     Head: Normocephalic and atraumatic.     Mouth/Throat:     Mouth: Mucous membranes are moist.  Eyes:     Pupils: Pupils are equal, round, and reactive to light.  Cardiovascular:     Rate and Rhythm: Regular rhythm. Tachycardia present.     Heart sounds: Normal heart sounds. No murmur. No friction rub.  Pulmonary:     Effort: Pulmonary  effort is normal. No respiratory distress.     Breath sounds: Normal breath sounds. No wheezing.  Abdominal:     General: Bowel sounds are normal.     Palpations: Abdomen is soft.     Tenderness: There is no abdominal tenderness. There is no rebound.  Musculoskeletal:        General: No tenderness.     Cervical back: Neck supple.     Right lower leg: No edema.     Left lower leg: No edema.  Lymphadenopathy:     Cervical: No cervical adenopathy.  Skin:    General: Skin is warm and dry.  Neurological:     Mental Status: He is alert and oriented to person, place, and time.  Psychiatric:        Mood and Affect: Mood normal.     ED Results / Procedures / Treatments    Labs (all labs ordered are listed, but only abnormal results are displayed) Labs Reviewed  CBC WITH DIFFERENTIAL/PLATELET - Abnormal; Notable for the following components:      Result Value   WBC 10.7 (*)    Lymphs Abs 4.2 (*)    Monocytes Absolute 1.1 (*)    All other components within normal limits  BASIC METABOLIC PANEL - Abnormal; Notable for the following components:   Potassium 3.3 (*)    CO2 20 (*)    Glucose, Bld 144 (*)    Anion gap 19 (*)    All other components within normal limits  D-DIMER, QUANTITATIVE (NOT AT [facility]) - Abnormal; Notable for the following components:   D-Dimer, Quant 0.53 (*)    All other components within normal limits  RAPID URINE DRUG SCREEN, HOSP PERFORMED - Abnormal; Notable for the following components:   Amphetamines POSITIVE (*)    Tetrahydrocannabinol POSITIVE (*)    All other components within normal limits  ETHANOL  TROPONIN I (HIGH SENSITIVITY)    EKG EKG Interpretation  Date/Time:  Monday August 26 2019 00:26:43 EST Ventricular Rate:  152 PR Interval:    QRS Duration: 89 QT Interval:  300 QTC Calculation: 477 R Axis:   98 Text Interpretation: Sinus tachycardia Borderline right axis deviation Abnormal T, consider ischemia, inferior leads Confirmed by Ross Marcus ([id number]) on 08/26/2019 12:55:41 AM   Radiology DG Chest Portable 1 View  Result Date: 08/26/2019 CLINICAL DATA:  Palpitations.  Chest pain. EXAM: PORTABLE CHEST 1 VIEW COMPARISON:  08/12/2019 FINDINGS: The heart size and mediastinal contours are within normal limits. Both lungs are clear. The visualized skeletal structures are unremarkable. IMPRESSION: No active disease. Electronically Signed   By: Katherine Mantle M.D.   On: 08/26/2019 01:08    Procedures Procedures (including critical care time)  Medications Ordered in ED Medications  sodium chloride 0.9 % bolus 1,000 mL (0 mLs Intravenous Stopped 08/26/19 0144)  LORazepam (ATIVAN) injection 1 mg (1 mg  Intravenous Given 08/26/19 0039)  sodium chloride 0.9 % bolus 1,000 mL (0 mLs Intravenous Stopped 08/26/19 0243)    ED Course  I have reviewed the triage vital signs and the nursing notes.  Pertinent labs & imaging results that were available during my care of the patient were reviewed by me and considered in my medical decision making (see chart for details).    MDM Rules/Calculators/A&P                       Patient presents with palpitations.  Initial heart rate in the 140s.  He is  very anxious appearing.  Also reports binge drinking approximately 24 hours ago.  Similar episode in the past.  He has been treated for anxiety.  He denies additional drug use.  EKG shows sinus tachycardia without arrhythmia.  Chest x-ray is clear.  Initial testing including D-dimer was sent.  However, upon chart review, patient had a negative CT angio of the chest at the end of January less than 1 month ago.  D-dimer 0.53.  This is slightly above normal.  Patient's heart rate normalized with Ativan and fluids.  Have low suspicion at this time for PE and thus will not repeat CT scan.  Patient with mild hypokalemia.  He also has some persistent mild metabolic derangement causing anion gap.  He was given 2 L of fluid.  This could be related to recent alcohol use.  He also was notably tachypneic which may drive down his bicarbonate.  Alcohol level negative.  Of note, he has both amphetamine and marijuana positive.  Patient adamantly denies any illicit stimulant use.  I discussed with him that if he is using stimulant drugs, this would likely worsen any palpitations or tachycardia.  Troponin negative and doubt ACS.  Given recurrent symptoms with multiple episodes of similar tachycardia and palpitations in the past, will have patient follow-up with cardiology for Holter monitoring.  Recommend that he avoid any stimulants, caffeine, and alcohol.  On final evaluation, patient's heart rate is 100.  He is resting  comfortably.  After history, exam, and medical workup I feel the patient has been appropriately medically screened and is safe for discharge home. Pertinent diagnoses were discussed with the patient. Patient was given return precautions.   Final Clinical Impression(s) / ED Diagnoses Final diagnoses:  Palpitations  Tachycardia    Rx / DC Orders ED Discharge Orders    None       Dalya Maselli, Mayer Masker, MD 08/26/19 [phone]

## 2019-08-26 NOTE — Discharge Instructions (Signed)
You were seen today for elevated heart rate.  This resolved with fluids and medication.  Your UDS was positive for amphetamines.  You should avoid stimulant medications, caffeine, and alcohol.  Given that you have had several episodes in the past, follow-up closely with cardiology for possible Holter monitoring.

## 2019-08-26 NOTE — ED Triage Notes (Signed)
Reports feeling like his heart was racing so he drove himself here.  Per patient both hands "locked up" on the way here.  Encouraged to slow down breathing.  Able to walk back to room from lobby.

## 2019-08-26 NOTE — Telephone Encounter (Signed)
New Message:   Pt was seen in [facility] ER today and was instructed on discharge summary to get a Holter Monitor. Please review this, I do not see an order in the system for a Monitor.

## 2019-08-26 NOTE — ED Notes (Addendum)
Pt asked me to update his mother that he was here at the ED. He called his mother and I updated her per his request.

## 2019-08-27 ENCOUNTER — Ambulatory Visit: Payer: BC Managed Care – PPO | Admitting: Cardiology

## 2019-08-28 ENCOUNTER — Ambulatory Visit (INDEPENDENT_AMBULATORY_CARE_PROVIDER_SITE_OTHER): Payer: BC Managed Care – PPO | Admitting: Cardiology

## 2019-08-28 ENCOUNTER — Encounter: Payer: Self-pay | Admitting: Cardiology

## 2019-08-28 ENCOUNTER — Ambulatory Visit (INDEPENDENT_AMBULATORY_CARE_PROVIDER_SITE_OTHER): Payer: BC Managed Care – PPO

## 2019-08-28 ENCOUNTER — Other Ambulatory Visit: Payer: Self-pay

## 2019-08-28 VITALS — BP 148/98 | HR 87 | Ht 70.0 in | Wt 174.0 lb

## 2019-08-28 DIAGNOSIS — U071 COVID-19: Secondary | ICD-10-CM

## 2019-08-28 DIAGNOSIS — R002 Palpitations: Secondary | ICD-10-CM | POA: Insufficient documentation

## 2019-08-28 DIAGNOSIS — R0600 Dyspnea, unspecified: Secondary | ICD-10-CM

## 2019-08-28 DIAGNOSIS — R011 Cardiac murmur, unspecified: Secondary | ICD-10-CM | POA: Insufficient documentation

## 2019-08-28 DIAGNOSIS — Z1329 Encounter for screening for other suspected endocrine disorder: Secondary | ICD-10-CM

## 2019-08-28 NOTE — Progress Notes (Signed)
Cardiology Office Note:    Date:  08/28/2019   ID:  Jeffrey Mercer, DOB 1995-09-08, MRN [id number]  PCP:  Patient, No Pcp Per  Cardiologist:  Jenean Lindau, MD   Referring MD: Merryl Hacker, MD    ASSESSMENT:    1. Dyspnea due to COVID-19   2. Palpitations   3. Thyroid disorder screen   4. Cardiac murmur    PLAN:    In order of problems listed above:  1. Palpitations: I discussed my findings with the patient at extensive length.  His palpitations do concern him so we will do a 3-day ZIO monitor.  He tells me that overall his heart rate is elevated.  I will also get a TSH and a Chem-7. 2. Hypokalemia: Diagnosed in the past on the emergency room visit so we will get a Chem-7 today.  Potassium supplementation and diet was encouraged 3. Cardiac murmur: Echocardiogram will be done to assess this.  Also has had some shortness of breath after being diagnosed with Covid and this will help assessment of shortness of breath also. 4. Patient will be seen in follow-up appointment in 2 months or earlier if the patient has any concerns    Medication Adjustments/Labs and Tests Ordered: Current medicines are reviewed at length with the patient today.  Concerns regarding medicines are outlined above.  Orders Placed This Encounter  Procedures  . LONG TERM MONITOR (3-14 DAYS)  . ECHOCARDIOGRAM COMPLETE   No orders of the defined types were placed in this encounter.    History of Present Illness:    Jeffrey Mercer is a 24 y.o. male who is being seen today for the evaluation of palpitations at the request of Horton, Barbette Hair, MD.  Patient is a pleasant 24 year old male.  Who is essentially healthy and contracted Covid mid last month.  Subsequently tells me that he feels tired and fatigued.  He has shortness of breath when he tries to exert more than usual and is seen by his primary care physician for this.  Recently has been experiencing palpitations and went to the emergency room.   At the time of my evaluation, the patient is alert awake oriented and in no distress.  No dizziness syncope or any such issues.  Past Medical History:  Diagnosis Date  . Anxiety     Past Surgical History:  Procedure Laterality Date  . CHOLECYSTECTOMY      Current Medications: Current Meds  Medication Sig  . hydrOXYzine (ATARAX/VISTARIL) 25 MG tablet Take 25 mg by mouth 3 (three) times daily as needed.     Allergies:   Patient has no known allergies.   Social History   Socioeconomic History  . Marital status: Single    Spouse name: Not on file  . Number of children: Not on file  . Years of education: Not on file  . Highest education level: Not on file  Occupational History  . Not on file  Tobacco Use  . Smoking status: Current Every Day Smoker  . Smokeless tobacco: Never Used  Substance and Sexual Activity  . Alcohol use: Yes    Comment: weekly  . Drug use: Never  . Sexual activity: Not on file  Other Topics Concern  . Not on file  Social History Narrative  . Not on file   Social Determinants of Health   Financial Resource Strain:   . Difficulty of Paying Living Expenses: Not on file  Food Insecurity:   . Worried About Estate manager/land agent  of Food in the Last Year: Not on file  . Ran Out of Food in the Last Year: Not on file  Transportation Needs:   . Lack of Transportation (Medical): Not on file  . Lack of Transportation (Non-Medical): Not on file  Physical Activity:   . Days of Exercise per Week: Not on file  . Minutes of Exercise per Session: Not on file  Stress:   . Feeling of Stress : Not on file  Social Connections:   . Frequency of Communication with Friends and Family: Not on file  . Frequency of Social Gatherings with Friends and Family: Not on file  . Attends Religious Services: Not on file  . Active Member of Clubs or Organizations: Not on file  . Attends Banker Meetings: Not on file  . Marital Status: Not on file     Family History:  The patient's family history includes Heart attack in his maternal grandfather.  ROS:   Please see the history of present illness.    All other systems reviewed and are negative.  EKGs/Labs/Other Studies Reviewed:    The following studies were reviewed today: EKG from the emergency room revealed sinus tachycardia and nonspecific ST-T changes.   Recent Labs: 07/29/2019: ALT 18 08/26/2019: BUN 7; Creatinine, Ser 0.83; Hemoglobin 14.3; Platelets 357; Potassium 3.3; Sodium 138  Recent Lipid Panel No results found for: CHOL, TRIG, HDL, CHOLHDL, VLDL, LDLCALC, LDLDIRECT  Physical Exam:    VS:  BP (!) 148/98   Pulse 87   Ht 5\' 10"  (1.778 m)   Wt 174 lb (78.9 kg)   SpO2 98%   BMI 24.97 kg/m     Wt Readings from Last 3 Encounters:  08/28/19 174 lb (78.9 kg)  08/26/19 170 lb (77.1 kg)  07/29/19 186 lb (84.4 kg)     GEN: Patient is in no acute distress HEENT: Normal NECK: No JVD; No carotid bruits LYMPHATICS: No lymphadenopathy CARDIAC: S1 S2 regular, 2/6 systolic murmur at the apex. RESPIRATORY:  Clear to auscultation without rales, wheezing or rhonchi  ABDOMEN: Soft, non-tender, non-distended MUSCULOSKELETAL:  No edema; No deformity  SKIN: Warm and dry NEUROLOGIC:  Alert and oriented x 3 PSYCHIATRIC:  Normal affect    Signed, 07/31/19, MD  08/28/2019 11:12 AM    [facility]

## 2019-08-28 NOTE — Patient Instructions (Signed)
Medication Instructions:  No medication changes *If you need a refill on your cardiac medications before your next appointment, please call your pharmacy*  Lab Work: You had a BMET and TSH today in the office. If you have labs (blood work) drawn today and your tests are completely normal, you will receive your results only by: Marland Kitchen MyChart Message (if you have MyChart) OR . A paper copy in the mail If you have any lab test that is abnormal or we need to change your treatment, we will call you to review the results.  Testing/Procedures: Your physician has requested that you have an echocardiogram. Echocardiography is a painless test that uses sound waves to create images of your heart. It provides your doctor with information about the size and shape of your heart and how well your heart's chambers and valves are working. This procedure takes approximately one hour. There are no restrictions for this procedure.  Your physician has recommended that you wear a Zio monitor. Zio monitors are medical devices that record the heart's electrical activity. Doctors most often [place] these monitors to diagnose arrhythmias. Arrhythmias are problems with the speed or rhythm of the heartbeat. The monitor is a small, portable device. You can wear one while you do your normal daily activities. This is usually used to diagnose what is causing palpitations/syncope (passing out).   Follow-Up: At [facility], you and your health needs are our priority.  As part of our continuing mission to provide you with exceptional heart care, we have created designated Provider Care Teams.  These Care Teams include your primary Cardiologist (physician) and Advanced Practice Providers (APPs -  Physician Assistants and Nurse Practitioners) who all work together to provide you with the care you need, when you need it.  Your next appointment:   2 month(s)  The format for your next appointment:   In Person  Provider:   Jyl Heinz, MD  Other Instructions  Echocardiogram An echocardiogram is a procedure that uses painless sound waves (ultrasound) to produce an image of the heart. Images from an echocardiogram can provide important information about:  Signs of coronary artery disease (CAD).  Aneurysm detection. An aneurysm is a weak or damaged part of an artery wall that bulges out from the normal force of blood pumping through the body.  Heart size and shape. Changes in the size or shape of the heart can be associated with certain conditions, including heart failure, aneurysm, and CAD.  Heart muscle function.  Heart valve function.  Signs of a past heart attack.  Fluid buildup around the heart.  Thickening of the heart muscle.  A tumor or infectious growth around the heart valves. Tell a health care provider about:  Any allergies you have.  All medicines you are taking, including vitamins, herbs, eye drops, creams, and over-the-counter medicines.  Any blood disorders you have.  Any surgeries you have had.  Any medical conditions you have.  Whether you are pregnant or may be pregnant. What are the risks? Generally, this is a safe procedure. However, problems may occur, including:  Allergic reaction to dye (contrast) that may be used during the procedure. What happens before the procedure? No specific preparation is needed. You may eat and drink normally. What happens during the procedure?   An IV tube may be inserted into one of your veins.  You may receive contrast through this tube. A contrast is an injection that improves the quality of the pictures from your heart.  A  gel will be applied to your chest.  A wand-like tool (transducer) will be moved over your chest. The gel will help to transmit the sound waves from the transducer.  The sound waves will harmlessly bounce off of your heart to allow the heart images to be captured in real-time motion. The images will be recorded on a  computer. The procedure may vary among health care providers and hospitals. What happens after the procedure?  You may return to your normal, everyday life, including diet, activities, and medicines, unless your health care provider tells you not to do that. Summary  An echocardiogram is a procedure that uses painless sound waves (ultrasound) to produce an image of the heart.  Images from an echocardiogram can provide important information about the size and shape of your heart, heart muscle function, heart valve function, and fluid buildup around your heart.  You do not need to do anything to prepare before this procedure. You may eat and drink normally.  After the echocardiogram is completed, you may return to your normal, everyday life, unless your health care provider tells you not to do that. This information is not intended to replace advice given to you by your health care provider. Make sure you discuss any questions you have with your health care provider. Document Revised: 10/18/2018 Document Reviewed: 07/30/2016 Elsevier Patient Education  2020 [employer].

## 2019-08-29 LAB — BASIC METABOLIC PANEL
BUN/Creatinine Ratio: 6 — ABNORMAL LOW (ref 9–20)
BUN: 4 mg/dL — ABNORMAL LOW (ref 6–20)
CO2: 19 mmol/L — ABNORMAL LOW (ref 20–29)
Calcium: 9.9 mg/dL (ref 8.7–10.2)
Chloride: 99 mmol/L (ref 96–106)
Creatinine, Ser: 0.72 mg/dL — ABNORMAL LOW (ref 0.76–1.27)
GFR calc Af Amer: 150 mL/min/{1.73_m2} (ref >59)
GFR calc non Af Amer: 130 mL/min/{1.73_m2} (ref >59)
Glucose: 82 mg/dL (ref 65–99)
Potassium: 3.5 mmol/L (ref 3.5–5.2)
Sodium: 139 mmol/L (ref 134–144)

## 2019-08-29 LAB — TSH: TSH: 1.9 u[IU]/mL (ref 0.450–4.500)

## 2019-09-09 ENCOUNTER — Other Ambulatory Visit: Payer: Self-pay

## 2019-09-09 ENCOUNTER — Ambulatory Visit (HOSPITAL_BASED_OUTPATIENT_CLINIC_OR_DEPARTMENT_OTHER)
Admission: RE | Admit: 2019-09-09 | Discharge: 2019-09-09 | Disposition: A | Discharge status: Home / Self Care | Payer: BC Managed Care – PPO | Source: Ambulatory Visit | Attending: Cardiology | Admitting: Cardiology

## 2019-09-09 DIAGNOSIS — R0600 Dyspnea, unspecified: Secondary | ICD-10-CM | POA: Insufficient documentation

## 2019-09-09 DIAGNOSIS — R002 Palpitations: Secondary | ICD-10-CM | POA: Diagnosis present

## 2019-09-09 DIAGNOSIS — U071 COVID-19: Secondary | ICD-10-CM | POA: Insufficient documentation

## 2019-09-09 NOTE — Progress Notes (Signed)
  Echocardiogram 2D Echocardiogram has been performed.  Jeffrey Mercer 09/09/2019, 10:07 AM

## 2019-10-17 ENCOUNTER — Ambulatory Visit: Payer: BC Managed Care – PPO | Admitting: Cardiology

## 2020-10-05 ENCOUNTER — Emergency Department (HOSPITAL_BASED_OUTPATIENT_CLINIC_OR_DEPARTMENT_OTHER)
Admission: EM | Admit: 2020-10-05 | Discharge: 2020-10-05 | Disposition: A | Discharge status: Home / Self Care | Payer: BC Managed Care – PPO | Attending: Emergency Medicine | Admitting: Emergency Medicine

## 2020-10-05 ENCOUNTER — Encounter (HOSPITAL_BASED_OUTPATIENT_CLINIC_OR_DEPARTMENT_OTHER): Payer: Self-pay

## 2020-10-05 ENCOUNTER — Other Ambulatory Visit: Payer: Self-pay

## 2020-10-05 ENCOUNTER — Emergency Department (HOSPITAL_BASED_OUTPATIENT_CLINIC_OR_DEPARTMENT_OTHER): Payer: BC Managed Care – PPO

## 2020-10-05 DIAGNOSIS — Z79899 Other long term (current) drug therapy: Secondary | ICD-10-CM | POA: Insufficient documentation

## 2020-10-05 DIAGNOSIS — R1013 Epigastric pain: Secondary | ICD-10-CM | POA: Insufficient documentation

## 2020-10-05 DIAGNOSIS — M546 Pain in thoracic spine: Secondary | ICD-10-CM | POA: Insufficient documentation

## 2020-10-05 DIAGNOSIS — G8929 Other chronic pain: Secondary | ICD-10-CM | POA: Diagnosis not present

## 2020-10-05 DIAGNOSIS — Z8616 Personal history of COVID-19: Secondary | ICD-10-CM | POA: Diagnosis not present

## 2020-10-05 DIAGNOSIS — F17200 Nicotine dependence, unspecified, uncomplicated: Secondary | ICD-10-CM | POA: Insufficient documentation

## 2020-10-05 DIAGNOSIS — I10 Essential (primary) hypertension: Secondary | ICD-10-CM | POA: Diagnosis not present

## 2020-10-05 DIAGNOSIS — R079 Chest pain, unspecified: Secondary | ICD-10-CM

## 2020-10-05 DIAGNOSIS — R072 Precordial pain: Secondary | ICD-10-CM | POA: Diagnosis present

## 2020-10-05 LAB — TROPONIN I (HIGH SENSITIVITY)
Troponin I (High Sensitivity): <2 ng/L (ref <18)
Troponin I (High Sensitivity): <2 ng/L (ref <18)

## 2020-10-05 LAB — D-DIMER, QUANTITATIVE: D-Dimer, Quant: 0.27 ug/mL-FEU (ref 0.00–0.50)

## 2020-10-05 LAB — COMPREHENSIVE METABOLIC PANEL
ALT: 23 U/L (ref 0–44)
AST: 25 U/L (ref 15–41)
Albumin: 5.2 g/dL — ABNORMAL HIGH (ref 3.5–5.0)
Alkaline Phosphatase: 52 U/L (ref 38–126)
Anion gap: 12 (ref 5–15)
BUN: 10 mg/dL (ref 6–20)
CO2: 25 mmol/L (ref 22–32)
Calcium: 9.9 mg/dL (ref 8.9–10.3)
Chloride: 101 mmol/L (ref 98–111)
Creatinine, Ser: 0.63 mg/dL (ref 0.61–1.24)
GFR, Estimated: >60 mL/min (ref >60)
Glucose, Bld: 111 mg/dL — ABNORMAL HIGH (ref 70–99)
Potassium: 3.7 mmol/L (ref 3.5–5.1)
Sodium: 138 mmol/L (ref 135–145)
Total Bilirubin: 0.4 mg/dL (ref 0.3–1.2)
Total Protein: 8.7 g/dL — ABNORMAL HIGH (ref 6.5–8.1)

## 2020-10-05 LAB — CBC WITH DIFFERENTIAL/PLATELET
Abs Immature Granulocytes: 0.02 10*3/uL (ref 0.00–0.07)
Basophils Absolute: 0.1 10*3/uL (ref 0.0–0.1)
Basophils Relative: 1 %
Eosinophils Absolute: 0.1 10*3/uL (ref 0.0–0.5)
Eosinophils Relative: 1 %
HCT: 47.5 % (ref 39.0–52.0)
Hemoglobin: 16.3 g/dL (ref 13.0–17.0)
Immature Granulocytes: 0 %
Lymphocytes Relative: 24 %
Lymphs Abs: 1.7 10*3/uL (ref 0.7–4.0)
MCH: 30.1 pg (ref 26.0–34.0)
MCHC: 34.3 g/dL (ref 30.0–36.0)
MCV: 87.6 fL (ref 80.0–100.0)
Monocytes Absolute: 0.6 10*3/uL (ref 0.1–1.0)
Monocytes Relative: 8 %
Neutro Abs: 4.7 10*3/uL (ref 1.7–7.7)
Neutrophils Relative %: 66 %
Platelets: 321 10*3/uL (ref 150–400)
RBC: 5.42 MIL/uL (ref 4.22–5.81)
RDW: 12.1 % (ref 11.5–15.5)
WBC: 7.1 10*3/uL (ref 4.0–10.5)
nRBC: 0 % (ref 0.0–0.2)

## 2020-10-05 MED ORDER — KETOROLAC TROMETHAMINE 15 MG/ML IJ SOLN
15.0000 mg | Freq: Once | INTRAMUSCULAR | Status: AC
  Administered 2020-10-05: 15 mg via INTRAVENOUS
  Filled 2020-10-05: qty 1

## 2020-10-05 MED ORDER — SODIUM CHLORIDE 0.9 % IV SOLN: INTRAVENOUS | Status: DC

## 2020-10-05 NOTE — Discharge Instructions (Signed)
Avoid any heavy lifting.  Try to take the muscle relaxer you already have for the back and tylenol.  Try taking half a tablet of the amlodipine for the next few days to make sure you are tolerating it and then go to the full tablet.  There is no sign of you having an allergic reaction to it today.  Everything with your heart looks normal today.  It will be important to follow-up with somebody about your back as you may need physical therapy or even an MRI.  Avoid any heavy lifting until you are evaluated.

## 2020-10-05 NOTE — ED Triage Notes (Signed)
CP this am with weakness and N/V.  Just started Norvasc yesterday.  Pt also c/o back pain upon awakening.  Also c/o SOB

## 2020-10-05 NOTE — ED Provider Notes (Signed)
[facility] EMERGENCY DEPARTMENT Provider Note   CSN: [id number] Arrival date & time: 10/05/20  0710     History Chief Complaint  Patient presents with  . Chest Pain    Judy Goodenow is a 25 y.o. male.  Patient is a 25 year old male with a history of anxiety, chronic upper back pain for the last year related to his job, Covid approximately a year ago with ongoing fatigue who is presenting today with several complaints.  Patient reports that he has had high blood pressure mention for quite some time and was recently seen at [facility] and started on amlodipine on Thursday.  He reports he was seen on Thursday because he was having upper back pain and some chest pain at that time.  Burgess Estelle was a pretty normal day he did take a dose of the amlodipine yesterday morning and felt fine throughout the day but this morning when he woke up he was having significant pain in his back and a dull discomfort in his chest.  Taking a deep breath makes the pain feel better.  He was feeling short of breath as well like he could not get a full breath and having some weakness and nausea.  He had a few dry heaves this morning but no active emesis.  No diarrhea or abdominal pain.  Patient does report that he has a muscle relaxer for the back pain but has not been taking it.  At work he lifts heavy objects with his upper body and thinks that that could be attributing to his issue.  No prior cardiac history and no prior history of family history of blood clots.  The history is provided by the patient and medical records.  Chest Pain Pain location:  Epigastric and substernal area Pain quality: aching and tightness   Pain radiates to:  Upper back Pain severity:  Severe Onset quality:  Unable to specify Timing:  Intermittent Progression:  Worsening      Past Medical History:  Diagnosis Date  . Anxiety     Patient Active Problem List   Diagnosis Date Noted  . Palpitations 08/28/2019  .  Cardiac murmur 08/28/2019  . Stiffness of right wrist joint 12/07/2017  . Closed fracture of distal end of radius 10/10/2017    Past Surgical History:  Procedure Laterality Date  . CHOLECYSTECTOMY         Family History  Problem Relation Age of Onset  . Heart attack Maternal Grandfather     Social History   Tobacco Use  . Smoking status: Current Every Day Smoker  . Smokeless tobacco: Never Used  Vaping Use  . Vaping Use: Never used  Substance Use Topics  . Alcohol use: Yes    Comment: weekly  . Drug use: Yes    Types: Marijuana    Home Medications Prior to Admission medications   Medication Sig Start Date End Date Taking? Authorizing Provider  amLODipine (NORVASC) 5 MG tablet Take 5 mg by mouth daily.   Yes [provider]  albuterol (VENTOLIN HFA) 108 (90 Base) MCG/ACT inhaler Inhale into the lungs. 08/12/19   [provider]  fluticasone furoate-vilanterol (BREO ELLIPTA) 100-25 MCG/INH AEPB Inhale into the lungs. 08/21/19   [provider]  hydrOXYzine (ATARAX/VISTARIL) 25 MG tablet Take 25 mg by mouth 3 (three) times daily as needed.    [provider]  methocarbamol (ROBAXIN) 500 MG tablet Take 1 tablet (500 mg total) by mouth 2 (two) times daily. Patient not  taking: No sig reported 05/20/19   Lorelee New, PA-C  pantoprazole (PROTONIX) 20 MG tablet Take 1 tablet (20 mg total) by mouth daily. 03/06/18 04/05/18  Virgina Norfolk, DO    Allergies    Patient has no known allergies.  Review of Systems   Review of Systems  Cardiovascular: Positive for chest pain.  All other systems reviewed and are negative.   Physical Exam Updated Vital Signs BP (!) 150/88   Pulse 76   Temp 97.9 F (36.6 C) (Oral)   Resp 15   Ht 5\' 9"  (1.753 m)   Wt 87.5 kg   SpO2 100%   BMI 28.50 kg/m   Physical Exam Vitals and nursing note reviewed.  Constitutional:      General: He is not in acute distress.    Appearance: He is well-developed.      Comments: Appears uncomfortable  HENT:     Head: Normocephalic and atraumatic.  Eyes:     Conjunctiva/sclera: Conjunctivae normal.     Pupils: Pupils are equal, round, and reactive to light.  Cardiovascular:     Rate and Rhythm: Regular rhythm. Tachycardia present.     Pulses: Normal pulses.     Heart sounds: No murmur heard.   Pulmonary:     Effort: Pulmonary effort is normal. No respiratory distress.     Breath sounds: Normal breath sounds. No wheezing or rales.  Abdominal:     General: There is no distension.     Palpations: Abdomen is soft.     Tenderness: There is no abdominal tenderness. There is no guarding or rebound.  Musculoskeletal:        General: No tenderness. Normal range of motion.     Cervical back: Normal range of motion and neck supple.       Back:     Right lower leg: No edema.     Left lower leg: No edema.  Skin:    General: Skin is warm and dry.     Capillary Refill: Capillary refill takes less than 2 seconds.     Findings: No erythema or rash.  Neurological:     Mental Status: He is alert and oriented to person, place, and time. Mental status is at baseline.     Sensory: No sensory deficit.     Motor: No weakness.  Psychiatric:        Mood and Affect: Mood normal.        Behavior: Behavior normal.        Thought Content: Thought content normal.      ED Results / Procedures / Treatments   Labs (all labs ordered are listed, but only abnormal results are displayed) Labs Reviewed  COMPREHENSIVE METABOLIC PANEL - Abnormal; Notable for the following components:      Result Value   Glucose, Bld 111 (*)    Total Protein 8.7 (*)    Albumin 5.2 (*)    All other components within normal limits  CBC WITH DIFFERENTIAL/PLATELET  D-DIMER, QUANTITATIVE  TROPONIN I (HIGH SENSITIVITY)  TROPONIN I (HIGH SENSITIVITY)    EKG EKG Interpretation  Date/Time:  Monday October 05 2020 07:18:03 EDT Ventricular Rate:  101 PR Interval:    QRS Duration: 94 QT  Interval:  361 QTC Calculation: 468 R Axis:   93 Text Interpretation: Sinus tachycardia Borderline right axis deviation T wave inversion RESOLVED SINCE PREVIOUS Confirmed by [birth year] (Gwyneth Sprout) on 10/05/2020 7:30:36 AM   Radiology DG Chest [facility] 1 View  Result  Date: 10/05/2020 CLINICAL DATA:  Chest pain, upper back pain, weakness, nausea, vomiting and shortness of breath. EXAM: PORTABLE CHEST 1 VIEW COMPARISON:  10/01/2020 and CT chest 07/29/2019. FINDINGS: Trachea is midline. Heart size normal. Lungs are clear. No pleural fluid. IMPRESSION: Negative. Electronically Signed   By: Leanna Battles M.D.   On: 10/05/2020 08:08    Procedures Procedures   Medications Ordered in ED Medications  0.9 %  sodium chloride infusion ( Intravenous Stopped 10/05/20 1159)  ketorolac (TORADOL) 15 MG/ML injection 15 mg (15 mg Intravenous Given 10/05/20 1158)    ED Course  I have reviewed the triage vital signs and the nursing notes.  Pertinent labs & imaging results that were available during my care of the patient were reviewed by me and considered in my medical decision making (see chart for details).    MDM Rules/Calculators/A&P                          Patient is a 25 year old male presenting today with nonspecific chest pain as well as upper back pain.  Upper back pain is seems to be musculoskeletal in nature.  It is worse with certain positions and movements.  This pain has been present for over a year and waxes and wanes in severity.  Patient did wake up this morning having some chest discomfort and nausea.  He has prior history of having GERD and has been given PPIs in the past.  He has no abdominal pain on my exam and is intermittently tachycardic when he starts appearing anxious and then resolves spontaneously.  He has had no URI symptoms in the last few days.  He was recently started on amlodipine for what appears to be more chronic hypertension.  He took a dose yesterday morning and has not  taken any this morning.  Patient EKG is normal today except for sinus tachycardia however D-dimer is negative and delta troponins are within normal limits.  EKG with no evidence to suggest pericarditis and low suspicion at this time for myocarditis.  Chest x-ray without evidence of pneumothorax or pneumonia.  No evidence of cardiac enlargement.  Patient did not receive any medications on repeat evaluation he reports he is feeling much better except for having the pain in his back.  He no longer mentions any discomfort in his chest and low suspicion for ACS or acute cardiac cause.  With negative D-dimer low suspicion for dissection or PE.  Patient was given medication for his back pain.  Given follow-up with sports medicine.  Given a work note to avoid heavy lifting with the upper extremities.  MDM Number of Diagnoses or Management Options   Amount and/or Complexity of Data Reviewed Clinical lab tests: ordered and reviewed Tests in the radiology section of CPT: ordered and reviewed Tests in the medicine section of CPT: ordered and reviewed Independent visualization of images, tracings, or specimens: yes  Patient Progress Patient progress: improved  Final Clinical Impression(s) / ED Diagnoses Final diagnoses:  Nonspecific chest pain  Hypertension, unspecified type  Chronic bilateral thoracic back pain    Rx / DC Orders ED Discharge Orders    None       Gwyneth Sprout, MD 10/05/20 1232

## 2021-03-03 IMAGING — CT CT ANGIO CHEST
2 of 8 series · 19 of 36 positions shown · IV contrast (Omnipaque)
Comparison: Chest radiograph 07/29/2019

CLINICAL DATA: Chest pain, shortness of breath, tachycardia.

EXAM:
CT ANGIOGRAPHY CHEST WITH CONTRAST
TECHNIQUE: Multidetector CT imaging of the chest was performed using the
standard protocol during bolus administration of intravenous
contrast. Multiplanar CT image reconstructions and MIPs were
obtained to evaluate the vascular anatomy.
CONTRAST:  100mL OMNIPAQUE IOHEXOL 350 MG/ML SOLN

[Series 6: pe coronal mpr · coronal · 0.52mm/px · 1 of 139 slices shown]
[im 70/139  mediastinal]
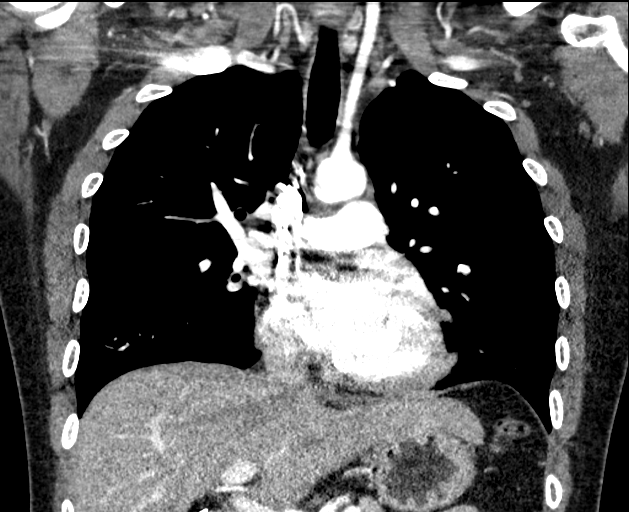

[Series 10: pe thins · axial · 0.71mm/px · z∈[+1184,+1421]mm · 18 of 265 slices shown]
[im 14/265  lung]
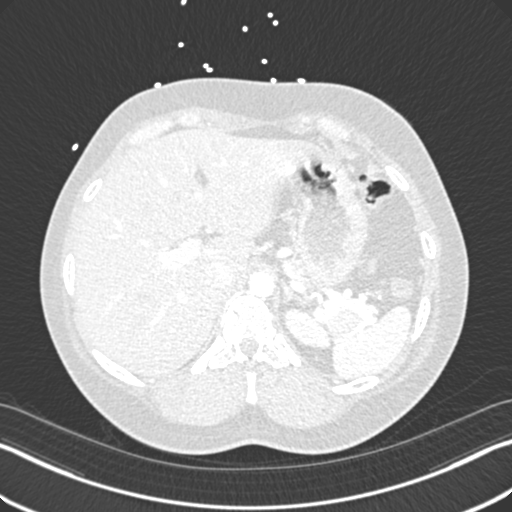
[im 28/265  mediastinal]
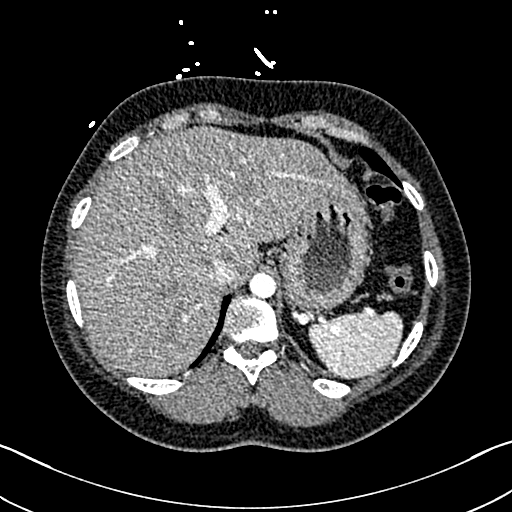
[im 42/265  lung]
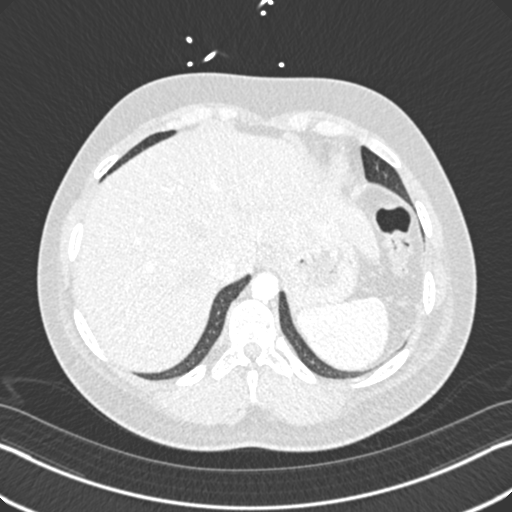
[im 56/265  mediastinal]
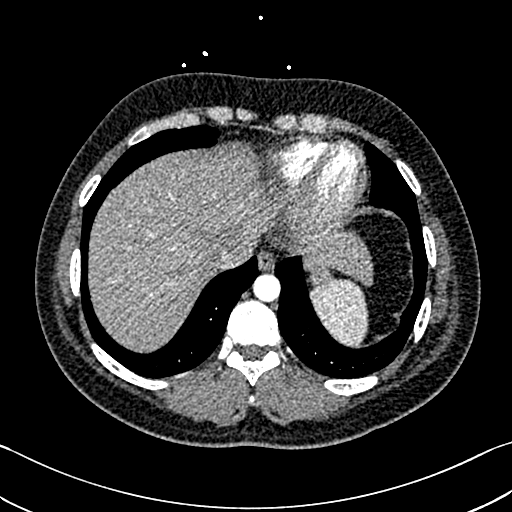
[im 70/265  lung]
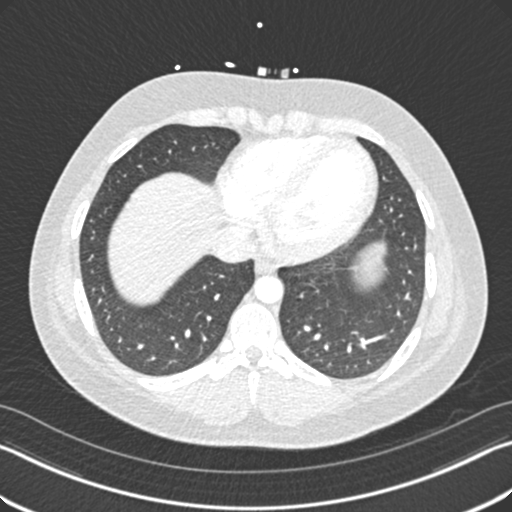
[im 84/265  mediastinal]
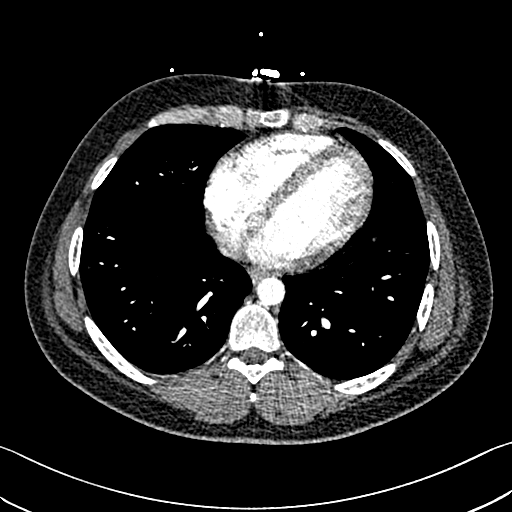
[im 98/265  lung]
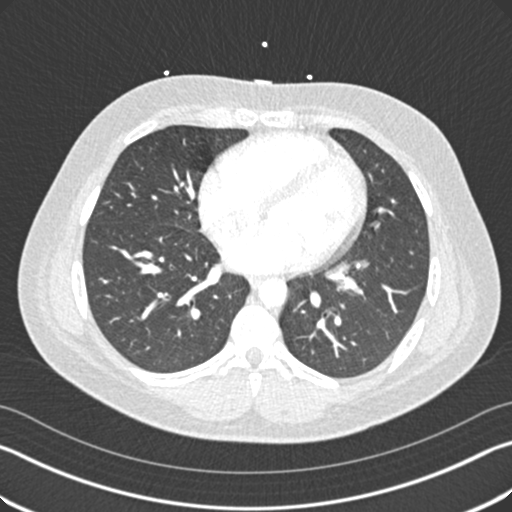
[im 112/265  mediastinal]
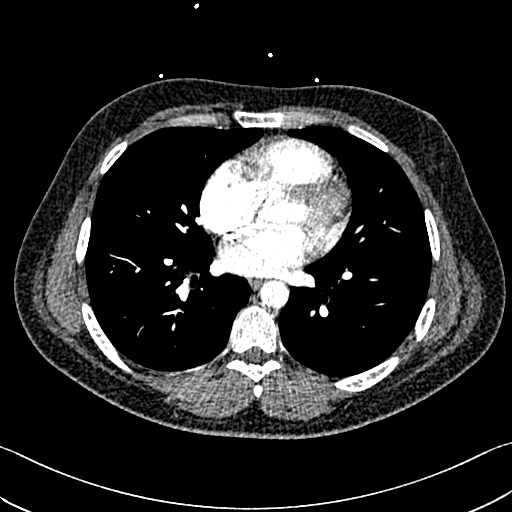
[im 126/265  lung]
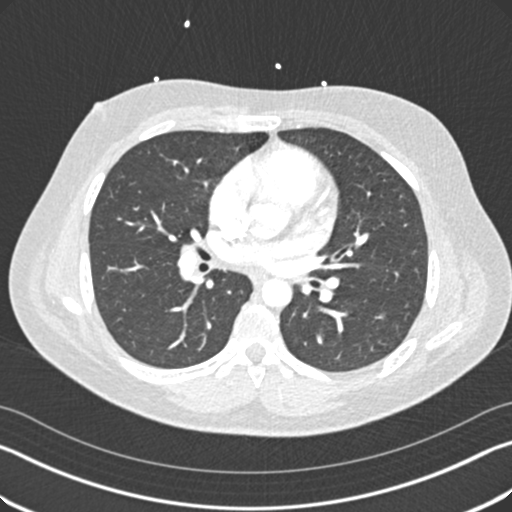
[im 139/265  mediastinal]
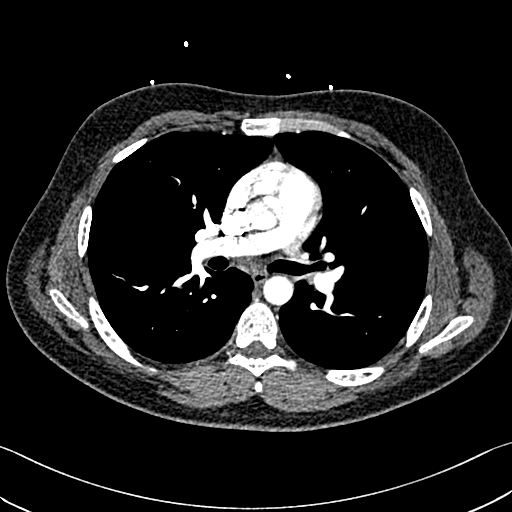
[im 153/265  lung]
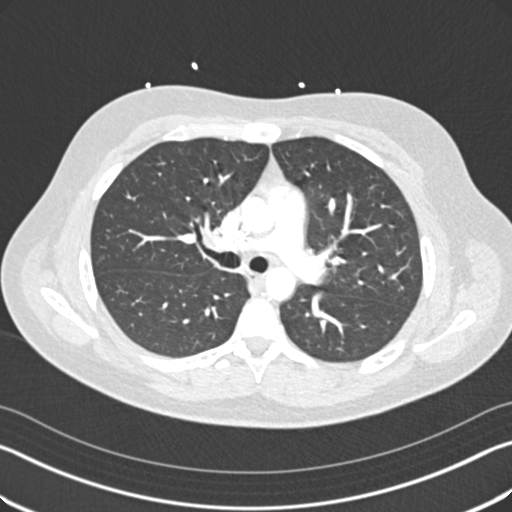
[im 167/265  mediastinal]
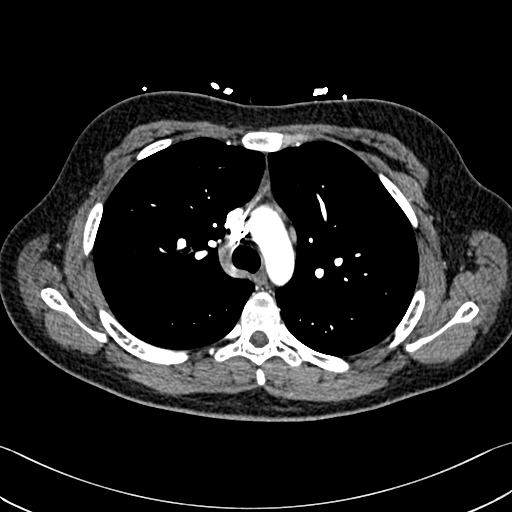
[im 181/265  lung]
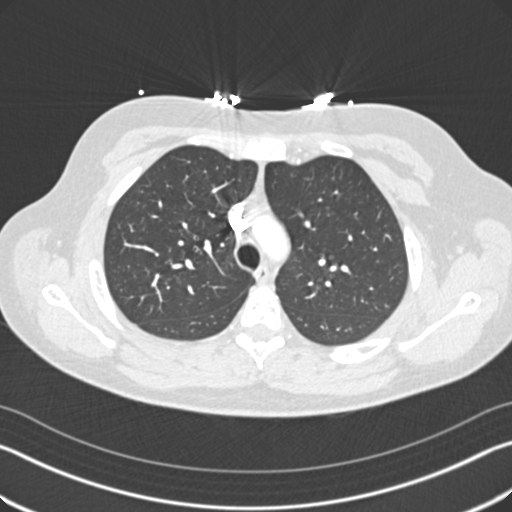
[im 195/265  mediastinal]
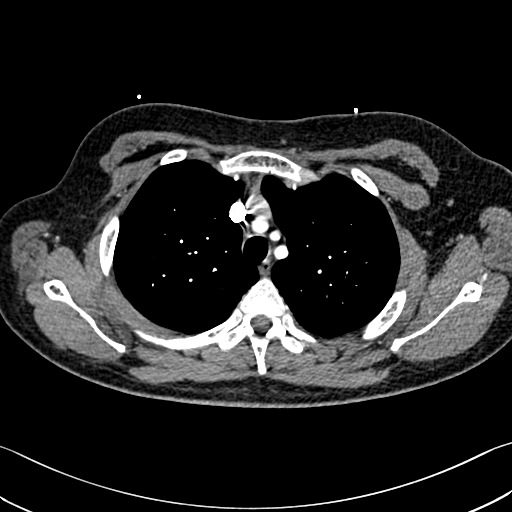
[im 209/265  lung]
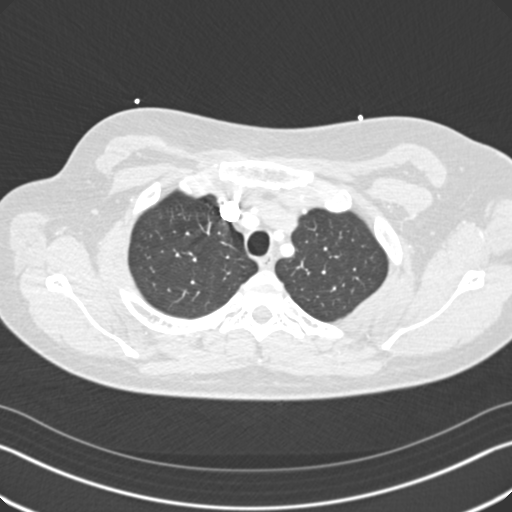
[im 223/265  mediastinal]
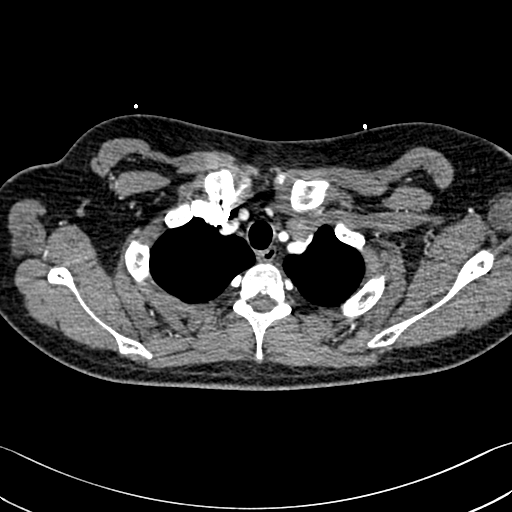
[im 237/265  lung]
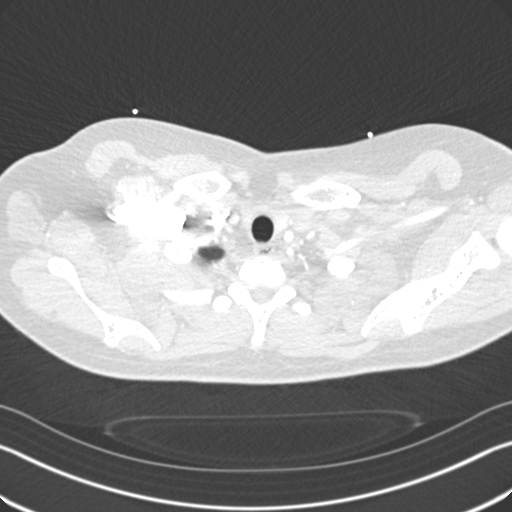
[im 251/265  mediastinal]
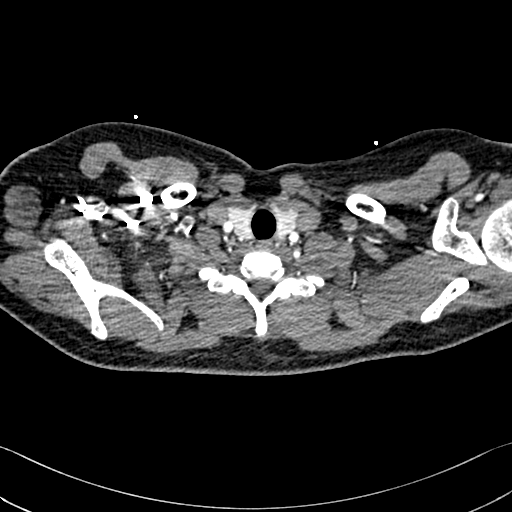

[19 of 36 positions shown; findings below may reference images not displayed]

FINDINGS: Cardiovascular: No filling defect is identified in the pulmonary
arterial tree to suggest pulmonary embolus.

Mediastinum/Nodes: Unremarkable

Lungs/Pleura: 0.6 by 0.4 by 0.4 cm right lower lobe pulmonary nodule
on image 61/5, 0.4 cm lingular nodule, image 60/5.

Upper Abdomen: Unremarkable

Musculoskeletal: Unremarkable

Review of the MIP images confirms the above findings.
IMPRESSION: 1. No filling defect is identified in the pulmonary arterial tree to
suggest pulmonary embolus.
2. There are 2 small pulmonary nodules in the right lower lobe and
lingula, both diverging less than 5 mm in diameter. In this age
group such findings are considered almost certainly benign, and
likely incidental. Fleischner criteria do not apply due to the
patient's young age.

## 2021-03-31 IMAGING — DX DG CHEST 1V PORT
1 series · 1 of 1 positions shown · non-contrast
Comparison: 08/12/2019

CLINICAL DATA: Palpitations.  Chest pain.

EXAM:
PORTABLE CHEST 1 VIEW

[chest ap]
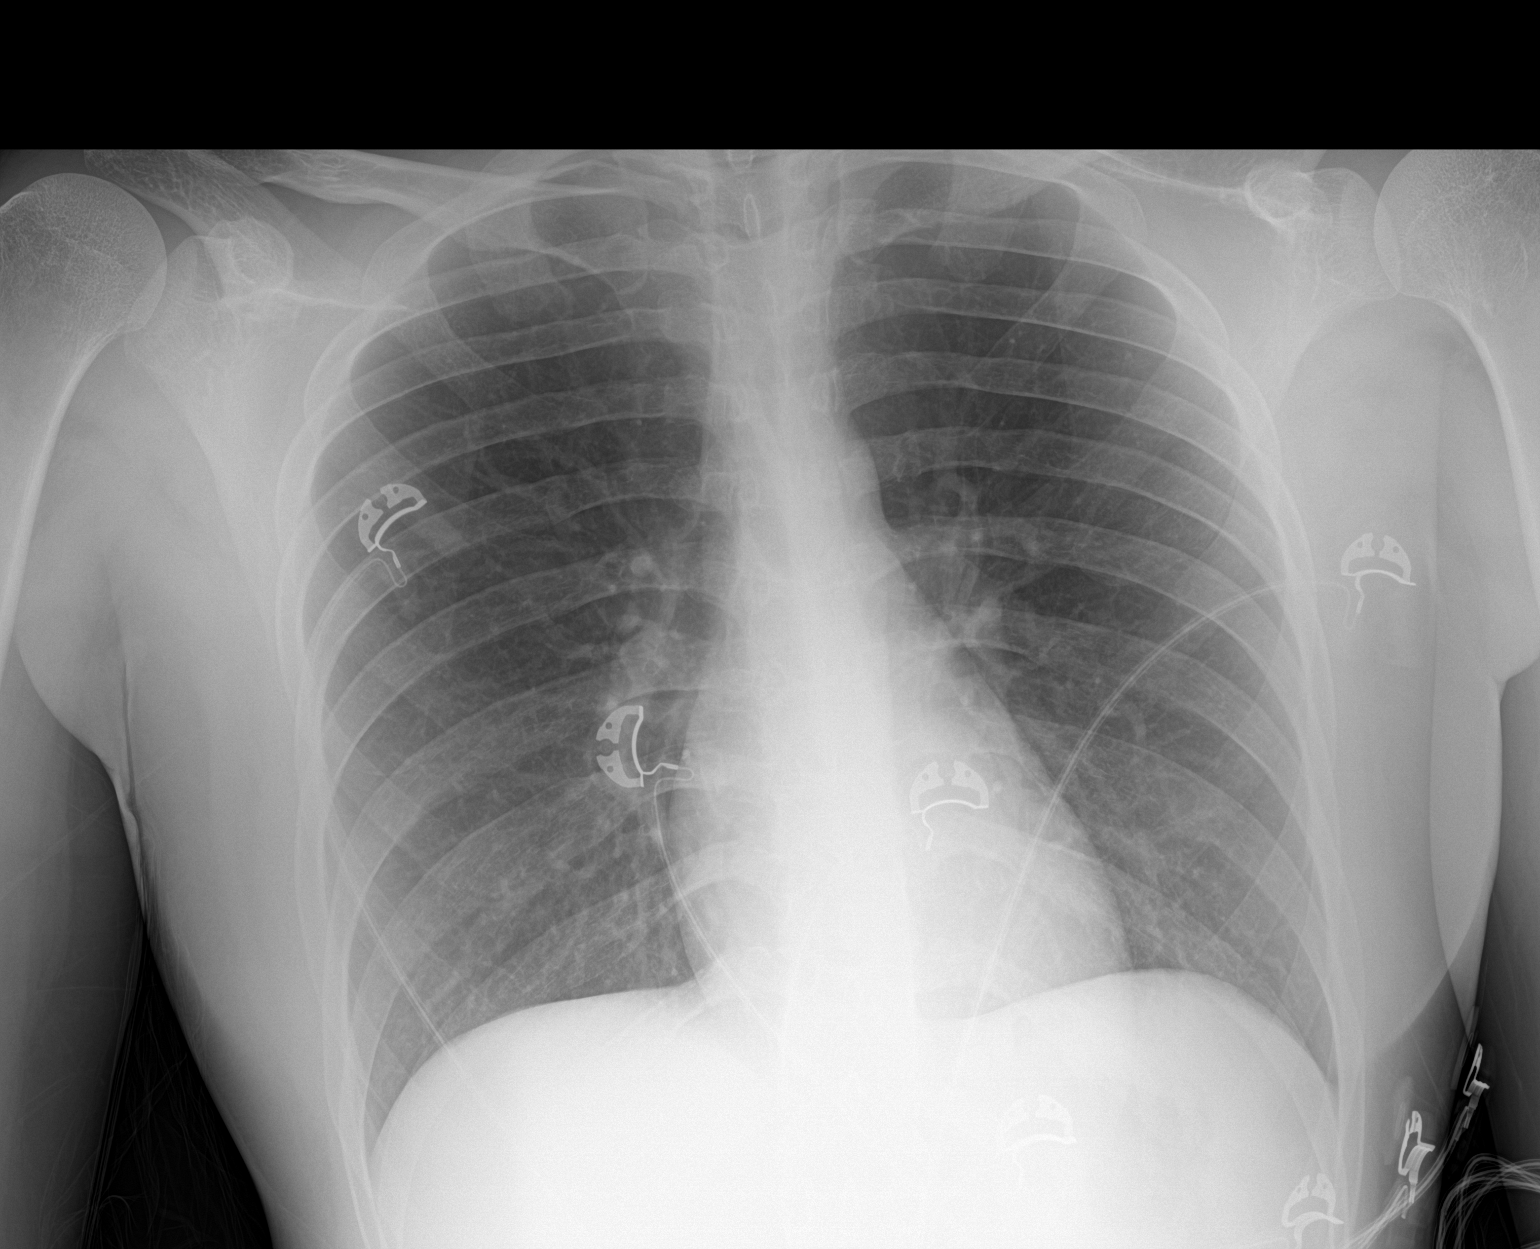

[1 of 1 positions shown; findings below may reference images not displayed]

FINDINGS: The heart size and mediastinal contours are within normal limits.
Both lungs are clear. The visualized skeletal structures are
unremarkable.
IMPRESSION: No active disease.

## 2022-05-11 ENCOUNTER — Ambulatory Visit (HOSPITAL_BASED_OUTPATIENT_CLINIC_OR_DEPARTMENT_OTHER): Payer: BC Managed Care – PPO | Admitting: Orthopaedic Surgery

## 2022-05-16 ENCOUNTER — Ambulatory Visit (HOSPITAL_BASED_OUTPATIENT_CLINIC_OR_DEPARTMENT_OTHER): Payer: BC Managed Care – PPO | Admitting: Orthopaedic Surgery
